# Patient Record
Sex: Female | Born: 1980
Health system: Southern US, Community
[De-identification: ages and names within clinical notes are randomized; demographics above are authoritative.]

## PROBLEM LIST (undated history)

## (undated) DIAGNOSIS — T7840XA Allergy, unspecified, initial encounter: Secondary | ICD-10-CM

## (undated) DIAGNOSIS — F419 Anxiety disorder, unspecified: Secondary | ICD-10-CM

## (undated) DIAGNOSIS — F980 Enuresis not due to a substance or known physiological condition: Secondary | ICD-10-CM

## (undated) HISTORY — DX: Enuresis not due to a substance or known physiological condition: F98.0

## (undated) HISTORY — DX: Anxiety disorder, unspecified: F41.9

## (undated) HISTORY — DX: Allergy, unspecified, initial encounter: T78.40XA

## (undated) HISTORY — PX: TONSILLECTOMY: SUR1361

---

## 2001-05-21 ENCOUNTER — Other Ambulatory Visit: Admission: RE | Admit: 2001-05-21 | Discharge: 2001-05-21 | Payer: Self-pay | Admitting: Family Medicine

## 2002-07-07 ENCOUNTER — Other Ambulatory Visit: Admission: RE | Admit: 2002-07-07 | Discharge: 2002-07-07 | Payer: Self-pay | Admitting: Family Medicine

## 2003-08-14 ENCOUNTER — Other Ambulatory Visit: Admission: RE | Admit: 2003-08-14 | Discharge: 2003-08-14 | Payer: Self-pay | Admitting: Family Medicine

## 2004-09-05 ENCOUNTER — Other Ambulatory Visit: Admission: RE | Admit: 2004-09-05 | Discharge: 2004-09-05 | Payer: Self-pay | Admitting: Family Medicine

## 2004-09-05 ENCOUNTER — Ambulatory Visit: Payer: Self-pay | Admitting: Family Medicine

## 2004-10-30 ENCOUNTER — Emergency Department: Payer: Self-pay | Admitting: Internal Medicine

## 2004-10-31 ENCOUNTER — Ambulatory Visit: Payer: Self-pay | Admitting: Internal Medicine

## 2005-06-16 ENCOUNTER — Ambulatory Visit: Payer: Self-pay | Admitting: Family Medicine

## 2005-07-31 ENCOUNTER — Ambulatory Visit (HOSPITAL_COMMUNITY): Admission: RE | Admit: 2005-07-31 | Discharge: 2005-07-31 | Payer: Self-pay | Admitting: Obstetrics and Gynecology

## 2006-09-24 ENCOUNTER — Inpatient Hospital Stay (HOSPITAL_COMMUNITY): Admission: AD | Admit: 2006-09-24 | Discharge: 2006-09-26 | Payer: Self-pay | Admitting: Obstetrics and Gynecology

## 2006-11-09 ENCOUNTER — Encounter: Payer: Self-pay | Admitting: Family Medicine

## 2007-03-08 ENCOUNTER — Ambulatory Visit: Payer: Self-pay | Admitting: Family Medicine

## 2007-03-08 DIAGNOSIS — J309 Allergic rhinitis, unspecified: Secondary | ICD-10-CM | POA: Insufficient documentation

## 2007-07-01 ENCOUNTER — Ambulatory Visit: Payer: Self-pay | Admitting: Family Medicine

## 2007-07-01 DIAGNOSIS — F419 Anxiety disorder, unspecified: Secondary | ICD-10-CM | POA: Insufficient documentation

## 2008-04-04 ENCOUNTER — Inpatient Hospital Stay (HOSPITAL_COMMUNITY): Admission: AD | Admit: 2008-04-04 | Discharge: 2008-04-05 | Payer: Self-pay | Admitting: Obstetrics and Gynecology

## 2008-09-18 ENCOUNTER — Emergency Department: Payer: Self-pay | Admitting: Internal Medicine

## 2008-11-25 ENCOUNTER — Ambulatory Visit: Payer: Self-pay | Admitting: Family Medicine

## 2008-11-25 DIAGNOSIS — N3944 Nocturnal enuresis: Secondary | ICD-10-CM | POA: Insufficient documentation

## 2008-11-25 LAB — CONVERTED CEMR LAB
Casts: 0 /lpf
Ketones, urine, test strip: NEGATIVE
Mucus, UA: 0
Nitrite: NEGATIVE
Specific Gravity, Urine: 1.015
Urine crystals, microscopic: 0 /hpf
WBC Urine, dipstick: NEGATIVE
WBC, UA: 0 cells/hpf

## 2008-12-09 ENCOUNTER — Telehealth: Payer: Self-pay | Admitting: Family Medicine

## 2009-02-16 ENCOUNTER — Ambulatory Visit: Payer: Self-pay | Admitting: Family Medicine

## 2009-03-23 ENCOUNTER — Ambulatory Visit: Payer: Self-pay | Admitting: Family Medicine

## 2009-03-23 DIAGNOSIS — L299 Pruritus, unspecified: Secondary | ICD-10-CM | POA: Insufficient documentation

## 2009-04-06 ENCOUNTER — Ambulatory Visit: Payer: Self-pay | Admitting: Family Medicine

## 2009-07-05 ENCOUNTER — Telehealth: Payer: Self-pay | Admitting: Family Medicine

## 2009-09-13 ENCOUNTER — Ambulatory Visit: Payer: Self-pay | Admitting: Family Medicine

## 2009-09-13 DIAGNOSIS — M722 Plantar fascial fibromatosis: Secondary | ICD-10-CM | POA: Insufficient documentation

## 2009-09-13 DIAGNOSIS — J029 Acute pharyngitis, unspecified: Secondary | ICD-10-CM | POA: Insufficient documentation

## 2009-09-13 LAB — CONVERTED CEMR LAB: Rapid Strep: NEGATIVE

## 2010-03-07 ENCOUNTER — Telehealth: Payer: Self-pay | Admitting: Family Medicine

## 2010-04-07 ENCOUNTER — Ambulatory Visit: Payer: Self-pay | Admitting: Family Medicine

## 2010-05-30 ENCOUNTER — Telehealth: Payer: Self-pay | Admitting: Family Medicine

## 2010-06-13 ENCOUNTER — Telehealth: Payer: Self-pay | Admitting: Family Medicine

## 2010-08-09 NOTE — Assessment & Plan Note (Signed)
Summary: FLU SHOT  Nurse Visit   Allergies: 1)  ! Pcn 2)  ! Erythromycin  Immunizations Administered:  Influenza Vaccine # 1:    Vaccine Type: Fluvax 3+    Site: left deltoid    Mfr: GlaxoSmithKline    Dose: 0.5 ml    Route: IM    Given by: Mervin Hack CMA (AAMA)    Exp. Date: 01/07/2011    Lot #: [id number]    VIS given: 02/01/10 version given April 07, 2010.  Flu Vaccine Consent Questions:    Do you have a history of severe allergic reactions to this vaccine? no    Any prior history of allergic reactions to egg and/or gelatin? no    Do you have a sensitivity to the preservative Thimersol? no    Do you have a past history of Guillan-Barre Syndrome? no    Do you currently have an acute febrile illness? no    Have you ever had a severe reaction to latex? no    Vaccine information given and explained to patient? yes    Are you currently pregnant? no  Orders Added: 1)  Flu Vaccine 24yrs + [90658] 2)  Admin 1st Vaccine [09811]

## 2010-08-09 NOTE — Progress Notes (Signed)
Summary: alprazolam   Phone Note Refill Request Message from:  Fax from Pharmacy on June 13, 2010 11:48 AM  Refills Requested: Medication #1:  ALPRAZOLAM 0.5 MG TABS 1 by mouth once daily in evening as needed anxiety.   Last Refilled: 03/07/2010 Refill request from [facility] [address]. [phone]  Initial call taken by: Melody Comas,  June 13, 2010 11:48 AM  Follow-up for Phone Call        px written on EMR for call in  Follow-up by: Judith Part MD,  June 13, 2010 3:56 PM  Additional Follow-up for Phone Call Additional follow up Details #1::        Medication phoned to [facility] pharmacy as instructed. Lewanda Rife LPN  June 13, 2010 4:17 PM     Prescriptions: ALPRAZOLAM 0.5 MG TABS (ALPRAZOLAM) 1 by mouth once daily in evening as needed anxiety  #30 x 0   Entered and Authorized by:   Judith Part MD   Signed by:   Lewanda Rife LPN on 75/64/3329   Method used:   [facility]ph[facility] ...       [facility]  [address] [id number]       [address]       [place], [place]  [address]       Ph: [phone]       Fax: [phone]   RxID:   [id number]

## 2010-08-09 NOTE — Progress Notes (Signed)
Summary: refill request for alprazolam  Phone Note Refill Request Message from:  Fax from Pharmacy  Refills Requested: Medication #1:  ALPRAZOLAM 0.5 MG TABS 1 by mouth once daily in evening as needed anxiety.   Last Refilled: 07/06/2009 Faxed request from [facility] [address], [phone]   Initial call taken by: Lowella Petties CMA,  March 07, 2010 1:13 PM  Follow-up for Phone Call        px written on EMR for call in  Follow-up by: Judith Part MD,  March 07, 2010 1:15 PM  Additional Follow-up for Phone Call Additional follow up Details #1::        [facility] as instructed. Lewanda Rife LPN  March 07, 2010 2:29 PM     New/Updated Medications: ALPRAZOLAM 0.5 MG TABS (ALPRAZOLAM) 1 by mouth once daily in evening as needed anxiety Prescriptions: ALPRAZOLAM 0.5 MG TABS (ALPRAZOLAM) 1 by mouth once daily in evening as needed anxiety  #30 x 0   Entered and Authorized by:   Judith Part MD   Signed by:   Lewanda Rife LPN on 29/52/8413   Method used:   Telephoned to ...       [facility]  [id number] (retail)       [address]       [place], [place]  [address]       Ph: [phone]       Fax: [phone]   RxID:   [id number]

## 2010-08-09 NOTE — Assessment & Plan Note (Signed)
Summary: STREP THROAT (?) / LFW   Vital Signs:  Patient profile:   30 year old female Height:      67 inches Weight:      125.4 pounds BMI:     19.71 Temp:     97.9 degrees F oral Pulse rate:   72 / minute Pulse rhythm:   regular BP sitting:   90 / 62  (left arm) Cuff size:   regular  Vitals Entered By: Benny Lennert CMA Duncan Dull) (September 13, 2009 3:11 PM)  History of Present Illness: Chief complaint strep throat  Acute Visit History:      The patient complains of sore throat.  These symptoms began 3 days ago.  She denies cough, fever, headache, musculoskeletal symptoms, and nasal discharge.  Other comments include: DAUGHTER WITH STREP.        There has been a recent exposure to strep.  There is no history of dysphagia, drooling, or cold/URI symptoms associated with the sore throat.        Urine output has been normal.  She is tolerating clear liquids.        Allergies: 1)  ! Pcn 2)  ! Erythromycin  Review of Systems       REVIEW OF SYSTEMS GEN: Acute illness details above. CV: No chest pain or SOB GI: No noted N or V Otherwise, pertinent positives and negatives are noted in the HPI.   Physical Exam  General:  Well-developed,well-nourished,in no acute distress; alert,appropriate and cooperative throughout examination Head:  Normocephalic and atraumatic without obvious abnormalities. No apparent alopecia or balding. Ears:  External ear exam shows no significant lesions or deformities.  Otoscopic examination reveals clear canals, tympanic membranes are intact bilaterally without bulging, retraction, inflammation or discharge. Hearing is grossly normal bilaterally. Nose:  no external deformity.   Mouth:  no gingival abnormalities, no dental plaque, and pharynx pink and moist.   Neck:  No deformities, masses, or tenderness noted. Lungs:  Normal respiratory effort, chest expands symmetrically. Lungs are clear to auscultation, no crackles or wheezes. Heart:  Normal rate and  regular rhythm. S1 and S2 normal without gallop, murmur, click, rub or other extra sounds.   Impression & Recommendations:  Problem # 1:  PHARYNGITIS-ACUTE (ICD-462) daughter with strep treat with abx  pcn allergic rapid strep neg, but daughter with strep  Her updated medication list for this problem includes:    Azithromycin 250 Mg Tabs (Azithromycin) .Marland Kitchen... 2 by  mouth today and then 1 daily for 4 days  Complete Medication List: 1)  Fluticasone Propionate 50 Mcg/act Susp (Fluticasone propionate) .... 2 sprays in each nostril once daily 2)  Ortho Tri-cyclen (28) 0.035 Mg Tabs (Norgestimate-ethinyl estradiol) .... One tab by mouth daily 3)  Zyrtec Allergy 10 Mg Tabs (Cetirizine hcl) .... Take 1 tablet by mouth once a day as needed 4)  Desmopressin Acetate 0.2 Mg Tabs (Desmopressin acetate) .... 2 by mouth  at bedtime as directed 5)  Lexapro 10 Mg Tabs (Escitalopram oxalate) .Marland Kitchen.. 1 by mouth once daily 6)  Alprazolam 0.5 Mg Tabs (Alprazolam) .Marland Kitchen.. 1 by mouth once daily in evening as needed anxiety 7)  Azithromycin 250 Mg Tabs (Azithromycin) .... 2 by  mouth today and then 1 daily for 4 days  Other Orders: Rapid Strep ([address]) Prescriptions: AZITHROMYCIN 250 MG  TABS (AZITHROMYCIN) 2 by  mouth today and then 1 daily for 4 days  #6 x 0   Entered and Authorized by:   Hannah Beat MD  Signed by:   Hannah Beat MD on 09/13/2009   Method used:   [facility]ronically [facility]        [id number]ail[facility]       [address]       [place], [place]  [address]       Ph: [phone]       Fax: [phone]   RxID:   [id number]   Current Allergies (reviewed today): ! PCN ! ERYTHROMYCIN  Laboratory Results    Other Tests  Rapid Strep: negative  Kit Test Internal QC: Negative   (Normal Range: Negative)

## 2010-08-09 NOTE — Progress Notes (Signed)
Summary: Lexapro 10mg   Phone Note Refill Request Call back at [phone] [facility] on May 30, 2010 3:33 PM  Refills Requested: Medication #1:  LEXAPRO 10 MG TABS 1 by mouth once daily   Last Refilled: 04/05/2010 [facility] on Lexapro 10mg . At pt's last visit with Dr Milinda Antis 03/23/09 pt was to follow up in 6 months /  March 2011. Pt has not seen Dr Milinda Antis since the 03/23/09 visit. I called pt to make f/u appt but  left v/m. Since the Lexapro is being given for anxiety instead of depression I wanted to check  about refill. Dr Milinda Antis is out today sick.Please advise.    Method Requested: Telephone to Pharmacy Initial call taken by: Lewanda Rife LPN,  May 30, 2010 3:40 PM  Follow-up for Phone Call        please fill #30, no rf, have her schedule follow up.  thanks.  Follow-up by: Crawford Givens MD,  May 30, 2010 3:46 PM  Additional Follow-up for Phone Call Additional follow up Details #1::        Unable to reach pt by phone but left v/m for pt to call for appt with Dr Milinda Antis. [facility] with a note for pt to call for appt.Lewanda Rife LPN  May 30, 2010 4:05 PM

## 2010-08-20 ENCOUNTER — Inpatient Hospital Stay (HOSPITAL_COMMUNITY)
Admission: AD | Admit: 2010-08-20 | Discharge: 2010-08-20 | Disposition: A | Discharge status: Home / Self Care | Payer: PRIVATE HEALTH INSURANCE | Source: Ambulatory Visit | Attending: Obstetrics | Admitting: Obstetrics

## 2010-08-20 DIAGNOSIS — N920 Excessive and frequent menstruation with regular cycle: Secondary | ICD-10-CM | POA: Insufficient documentation

## 2010-08-20 LAB — CBC
HCT: 36.4 % (ref 36.0–46.0)
Hemoglobin: 12.1 g/dL (ref 12.0–15.0)
Platelets: 131 10*3/uL — ABNORMAL LOW (ref 150–400)
RBC: 3.92 MIL/uL (ref 3.87–5.11)
WBC: 5.6 10*3/uL (ref 4.0–10.5)

## 2010-08-20 LAB — WET PREP, GENITAL
Trich, Wet Prep: NONE SEEN
Yeast Wet Prep HPF POC: NONE SEEN

## 2010-08-20 LAB — URINALYSIS, ROUTINE W REFLEX MICROSCOPIC
Bilirubin Urine: NEGATIVE
Specific Gravity, Urine: 1.025 (ref 1.005–1.030)
Urine Glucose, Fasting: NEGATIVE mg/dL
Urobilinogen, UA: 0.2 mg/dL (ref 0.0–1.0)

## 2010-08-20 LAB — URINE MICROSCOPIC-ADD ON

## 2010-09-26 ENCOUNTER — Telehealth: Payer: Self-pay | Admitting: Family Medicine

## 2010-10-06 NOTE — Progress Notes (Signed)
Summary: alprazolam   Phone Note Refill Request Message from:  Fax from Pharmacy on September 26, 2010 3:30 PM  Refills Requested: Medication #1:  ALPRAZOLAM 0.5 MG TABS 1 by mouth once daily in evening as needed anxiety.   Last Refilled: 06/13/2010 Refill request from [facility]. [phone].   Initial call taken by: Melody Comas,  September 26, 2010 3:30 PM  Follow-up for Phone Call        px written on EMR for call in  Follow-up by: Judith Part MD,  September 26, 2010 5:03 PM  Additional Follow-up for Phone Call Additional follow up Details #1::        [facility] pharmacy as instructed. Lewanda Rife LPN  September 26, 2010 5:07 PM     New/Updated Medications: ALPRAZOLAM 0.5 MG TABS (ALPRAZOLAM) 1 by mouth once daily in evening as needed anxiety Prescriptions: ALPRAZOLAM 0.5 MG TABS (ALPRAZOLAM) 1 by mouth once daily in evening as needed anxiety  #30 x 0   Entered and Authorized by:   Judith Part MD   Signed by:   Lewanda Rife LPN on 11/91/4782   Method used:   Telephoned to ...       [facility]  [id number] (retail)       [address]       [place], [place]  [address]       Ph: [phone]       Fax: [phone]   RxID:   [id number]

## 2010-11-01 ENCOUNTER — Encounter: Payer: Self-pay | Admitting: Family Medicine

## 2010-11-01 ENCOUNTER — Ambulatory Visit (INDEPENDENT_AMBULATORY_CARE_PROVIDER_SITE_OTHER): Payer: PRIVATE HEALTH INSURANCE | Admitting: Family Medicine

## 2010-11-01 VITALS — BP 92/60 | HR 80 | Temp 98.1°F | Wt 132.1 lb

## 2010-11-01 DIAGNOSIS — H109 Unspecified conjunctivitis: Secondary | ICD-10-CM

## 2010-11-01 MED ORDER — POLYMYXIN B-TRIMETHOPRIM 10000-0.1 UNIT/ML-% OP SOLN: 1.0000 [drp] | OPHTHALMIC | Status: AC

## 2010-11-01 NOTE — Assessment & Plan Note (Addendum)
D/w pt EA:[id number] and use of drops.  Doesn't wear contacts.  Fu prn. Okay for outpatient fu.  No FB seen. D/w pt that his may or may not be viral, but given the exposures I would start tx.  She understood.

## 2010-11-01 NOTE — Patient Instructions (Signed)
Start the drops today.  Wash your hands frequently.  Let us know if you have vision changes or other concerns.  Take care.

## 2010-11-01 NOTE — Progress Notes (Signed)
Both daughters with pinkeye.  Now with red eye this AM.  No discharge (yet).  No FCNAVD.  She used allergy drop w/o effect. No vision change.  No ear pain, no rhinorrhea, no ST.    Meds, vitals, and allergies reviewed.   ROS: See HPI.  Otherwise, noncontributory.  nad ncat Tm wnl, nasal and oral exam wnl Conjunctiva: Minimal injection bilaterally, no discharge.  No FB seen.  EOMI, perrl, fundus wnl b.  Neck supple, no la

## 2010-11-02 ENCOUNTER — Encounter: Payer: Self-pay | Admitting: Family Medicine

## 2010-11-22 NOTE — H&P (Signed)
Stephanie [name]                ACCOUNT NO.:  [id number]   MEDICAL RECORD NO.:  [id number]          PATIENT TYPE:  INP   LOCATION:  9119                          FACILITY:  [facility]   PHYSICIAN:  Lenoard Aden, M.D.DATE OF BIRTH:  1981-03-14   DATE OF ADMISSION:  04/04/2008  DATE OF DISCHARGE:                              HISTORY & PHYSICAL   CHIEF COMPLAINT:  Advanced dilatation with mature amnio.   HISTORY OF PRESENT ILLNESS:  She is a 27-year white female G2, P1 at 63-  1/2 weeks with a history of precipitous labor, history of advanced  dilatation, who leaves approximately 1 hour in the hospital, who is now  presents for timed and controlled attempt vaginal delivery, status post  amniocentesis 1 day ago with a mature LS and PG ratio of 3.6, and PT  positive.  The patient has been apprised of the potential risk factors  of prematurity with delivery at 37-38 weeks despite a normal amnio.  This case was discussed with Maternal Fetal Medicine who acknowledges  that this is a reasonable plan for delivery.   ALLERGIES:  She has no known drug allergies.   MEDICATIONS:  Prenatal vitamins.   SOCIAL HISTORY:  She is a nonsmoker, nondrinker.  She denies domestic  physical violence.   REVIEW OF SYSTEMS:  She has a history of advanced dilatation with  precipitous delivery with her first pregnancy at which time she  presented to the office at 8 cm and delivered within 2 hours.   PHYSICAL EXAMINATION:  GENERAL:  She is a well-developed, well-nourished  white female in no acute distress.  HEENT:  Normal.  LUNGS:  Clear.  HEART:  Regular rhythm.  ABDOMEN:  Soft, gravid, nontender.  Estimated fetal weight is 6-1/2 to 7  pounds, cervix is 7 cm, 8% vertex, 0 station.  EXTREMITIES:  There are no cords.  NEUROLOGIC:  Nonfocal.  SKIN:  Intact.   IMPRESSION:  1. 37-1/2-week intrauterine pregnancy.  2. Advanced dilatation with increased geographic distance from the      hospital and mature  amniocentesis.   PLAN:  Proceed with induction, epidural, anticipated attempts at vaginal  delivery.      Lenoard Aden, M.D.  Electronically Signed     RJT/MEDQ  D:  04/04/2008  T:  04/05/2008  Job:  [id number]

## 2010-11-25 NOTE — H&P (Signed)
Stephanie [name], Stephanie [name]                ACCOUNT NO.:  [id number]   MEDICAL RECORD NO.:  [id number]          PATIENT TYPE:  INP   LOCATION:  9161                          [facility]   PHYSICIAN:  Lenoard Aden, M.D.DATE OF BIRTH:  Dec 24, 1980   DATE OF ADMISSION:  09/24/2006  DATE OF DISCHARGE:                              HISTORY & PHYSICAL   CHIEF COMPLAINT:  Labor.   She is a 30 year old white female, G1, P0, at 38-2/7 weeks.  Presented  to the office 6 cm dilated.   SHE HAS NO KNOWN DRUG ALLERGIES.   MEDICATIONS:  Prenatal vitamins.   Pregnancy complicated by advanced dilatation.  She has a history of  tonsillectomy, Chlamydia, infertility for which she conceived on  __________ , history of anorexia.  She is a nonsmoker, nondrinker.  She  denies domestic or physical violence.   FAMILY HISTORY:  1. Heart disease.  2. Bronchitis.  3. Pancreatic, lung and breast cancer.   PHYSICAL EXAM:  She is a well-developed, well-nourished white female in  no acute distress.  HEENT:  Normal.  LUNGS:  Clear.  HEART:  Regular rhythm.  ABDOMEN:  Soft, gravid, nontender.  Estimated fetal weight 7.5 pounds.  Cervix is 6 cm, 90%, vertex +1.  EXTREMITIES:  Reveal no clubbing.  NEUROLOGIC:  Nonfocal.   IMPRESSION:  Term intrauterine pregnancy in active labor.   PLAN:  Anticipate attempts at vaginal delivery.      Lenoard Aden, M.D.  Electronically Signed     RJT/MEDQ  D:  09/24/2006  T:  09/24/2006  Job:  [id number]

## 2010-12-20 ENCOUNTER — Other Ambulatory Visit: Payer: Self-pay | Admitting: *Deleted

## 2010-12-20 MED ORDER — DESMOPRESSIN ACETATE 0.2 MG PO TABS: ORAL_TABLET | ORAL | Status: DC

## 2010-12-20 NOTE — Telephone Encounter (Signed)
Faxed request from [facility] for desmopressin acetate 0.2 mg, 2 tablets by mouth at bedtime, number 60.  This was last refilled on 07/05/09.  Pt states she only takes this occasionally but is now out and needs a refill.

## 2010-12-20 NOTE — Telephone Encounter (Signed)
Px written for call in   

## 2010-12-20 NOTE — Telephone Encounter (Signed)
[facility]  

## 2011-01-16 ENCOUNTER — Other Ambulatory Visit: Payer: Self-pay | Admitting: Family Medicine

## 2011-02-15 ENCOUNTER — Other Ambulatory Visit: Payer: Self-pay | Admitting: *Deleted

## 2011-02-15 MED ORDER — DESMOPRESSIN ACETATE 0.2 MG PO TABS: ORAL_TABLET | ORAL | Status: DC

## 2011-02-15 NOTE — Telephone Encounter (Signed)
Will send electonically

## 2011-03-28 ENCOUNTER — Other Ambulatory Visit: Payer: Self-pay

## 2011-03-28 MED ORDER — ALPRAZOLAM 0.5 MG PO TABS: 0.5000 mg | ORAL_TABLET | Freq: Every day | ORAL | Status: DC | PRN

## 2011-03-28 NOTE — Telephone Encounter (Signed)
[facility] [phone] request refill Alprazolam 0.5 mg. Was taken off med list when pt saw Dr Para March but I contacted pt and she does take periodically and asked to have med refilled.Please advise.

## 2011-03-28 NOTE — Telephone Encounter (Signed)
[facility]  

## 2011-03-28 NOTE — Telephone Encounter (Signed)
Px written for call in   

## 2011-04-10 LAB — CBC
HCT: 38.6
Hemoglobin: 11.6 — ABNORMAL LOW
MCHC: 34.1
MCHC: 34.1
MCV: 93.8
RBC: 3.57 — ABNORMAL LOW
RDW: 13.2
WBC: 11.8 — ABNORMAL HIGH
WBC: 8.5

## 2011-04-10 LAB — RPR: RPR Ser Ql: NONREACTIVE

## 2011-05-11 ENCOUNTER — Ambulatory Visit (INDEPENDENT_AMBULATORY_CARE_PROVIDER_SITE_OTHER): Payer: PRIVATE HEALTH INSURANCE

## 2011-05-11 DIAGNOSIS — Z23 Encounter for immunization: Secondary | ICD-10-CM

## 2011-07-14 ENCOUNTER — Ambulatory Visit (INDEPENDENT_AMBULATORY_CARE_PROVIDER_SITE_OTHER): Payer: PRIVATE HEALTH INSURANCE | Admitting: Family Medicine

## 2011-07-14 ENCOUNTER — Encounter: Payer: Self-pay | Admitting: Family Medicine

## 2011-07-14 VITALS — BP 90/62 | HR 80 | Temp 97.9°F | Ht 67.0 in | Wt 128.5 lb

## 2011-07-14 DIAGNOSIS — H6980 Other specified disorders of Eustachian tube, unspecified ear: Secondary | ICD-10-CM

## 2011-07-14 DIAGNOSIS — H6990 Unspecified Eustachian tube disorder, unspecified ear: Secondary | ICD-10-CM | POA: Insufficient documentation

## 2011-07-14 NOTE — Patient Instructions (Signed)
You have congestion of the inner ear canal - causing pressure and pain  Continue flonase  Try afrin long acting spray 1-2 times daily for 2 days (not more than 2 days) Also try mucinex D - to decongest also  Drink lots of fluids If not starting to improve next week please call  If fever or other symptoms - call

## 2011-07-14 NOTE — Progress Notes (Signed)
Subjective:    Patient ID: Stephanie [name], female    DOB: 1980/12/12, 31 y.o.   MRN: [id number]  HPI R earache for 2 days - while getting over a bad cold of 2 weeks  Still takes flonase and sinuses are difficult to clear  Not unusual to have ETD and popping ears Yesterday used an otc ear wax remover -- did not do anything  This am worse -- bad pain - refers to throat  Still coughs a bit in am  Congestion is getting better - blows nose in ams   Patient Active Problem List  Diagnoses  . ANXIETY STATE, UNSPECIFIED  . SORE THROAT  . ALLERGIC RHINITIS  . PRURITUS  . PLANTAR FASCIITIS  . NOCTURNAL ENURESIS  . Conjunctivitis  . Eustachian tube dysfunction   Past Medical History  Diagnosis Date  . Allergy   . Anxiety   . Enuresis     nocturnal/lifetime   Past Surgical History  Procedure Date  . Tonsillectomy    History  Substance Use Topics  . Smoking status: Never Smoker   . Smokeless tobacco: Not on file  . Alcohol Use: Not on file   Family History  Problem Relation Age of Onset  . Cancer Father     Pancreatic  . Cancer Paternal Grandmother     Breast CA   Allergies  Allergen Reactions  . Erythromycin     REACTION: GI UPSET  . Penicillins     REACTION: HIVES   Current Outpatient Prescriptions on File Prior to Visit  Medication Sig Dispense Refill  . fluticasone (FLONASE) 50 MCG/ACT nasal spray USE 2 SPRAYS IN EACH NOSTRIL ONCE DAILY  16 g  3  . Multiple Vitamin (MULTIVITAMIN) tablet Take 1 tablet by mouth daily.        Lorita Officer Triphasic (ORTHO TRI-CYCLEN, 28,) 0.18/0.215/0.25 MG-35 MCG TABS Take 1 tablet by mouth daily.        Marland Kitchen ALPRAZolam (XANAX) 0.5 MG tablet Take 1 tablet (0.5 mg total) by mouth daily as needed for sleep or anxiety.  30 tablet  0  . desmopressin (DDAVP) 0.2 MG tablet 2 by mouth at bedtime as needed  60 tablet  5  . fexofenadine (ALLEGRA) 180 MG tablet Take 180 mg by mouth daily as needed.            Review of  Systems Review of Systems  Constitutional: Negative for fever, appetite change, fatigue and unexpected weight change.  Eyes: Negative for pain and visual disturbance.  ENT pos for mild congestion/ ear pain/ neg for sinus pain or nosebleed Respiratory: Negative for sob or wheeze Cardiovascular: Negative for cp or palpitations    Gastrointestinal: Negative for nausea, diarrhea and constipation.  Genitourinary: Negative for urgency and frequency.  Skin: Negative for pallor or rash   Neurological: Negative for weakness, light-headedness, numbness and headaches.  Hematological: Negative for adenopathy. Does not bruise/bleed easily.  Psychiatric/Behavioral: Negative for dysphoric mood. The patient is not nervous/anxious.          Objective:   Physical Exam  Constitutional: She appears well-developed and well-nourished.  HENT:  Head: Normocephalic and atraumatic.  Right Ear: External ear normal.  Left Ear: External ear normal.  Mouth/Throat: Oropharynx is clear and moist.       Nares are injected and congested   R TM is dull and slt eff L TM nl  Throat clear- some clear post nasal drip  Eyes: Conjunctivae and EOM are normal. Pupils are  equal, round, and reactive to light. Right eye exhibits no discharge. Left eye exhibits no discharge.  Neck: Normal range of motion. Neck supple.  Cardiovascular: Normal rate, regular rhythm and normal heart sounds.   Pulmonary/Chest: Breath sounds normal.  Lymphadenopathy:    She has no cervical adenopathy.  Neurological: She is alert.  Skin: Skin is warm and dry. No rash noted. No pallor.  Psychiatric: She has a normal mood and affect.          Assessment & Plan:

## 2011-07-14 NOTE — Assessment & Plan Note (Signed)
Worse on R after viral uri  Disc symptomatic care - see instructions on AVS - will try some mucinex D and afrin No s/s of bacterial infection Update if not starting to improve in a week or if worsening

## 2011-09-12 ENCOUNTER — Other Ambulatory Visit: Payer: Self-pay | Admitting: Family Medicine

## 2011-09-17 MED ORDER — ALPRAZOLAM 0.5 MG PO TABS: 0.5000 mg | ORAL_TABLET | Freq: Every day | ORAL | Status: DC | PRN

## 2011-09-17 NOTE — Telephone Encounter (Signed)
Px written for call in   

## 2011-09-18 NOTE — Telephone Encounter (Signed)
Rx called to pharmacy to instructed.

## 2011-11-17 ENCOUNTER — Encounter: Payer: Self-pay | Admitting: Family Medicine

## 2011-11-17 ENCOUNTER — Ambulatory Visit (INDEPENDENT_AMBULATORY_CARE_PROVIDER_SITE_OTHER): Payer: PRIVATE HEALTH INSURANCE | Admitting: Family Medicine

## 2011-11-17 VITALS — BP 110/60 | HR 76 | Temp 97.9°F | Wt 125.8 lb

## 2011-11-17 DIAGNOSIS — N3944 Nocturnal enuresis: Secondary | ICD-10-CM

## 2011-11-17 MED ORDER — TOLTERODINE TARTRATE ER 4 MG PO CP24: 4.0000 mg | ORAL_CAPSULE | Freq: Every day | ORAL | Status: DC

## 2011-11-17 NOTE — Patient Instructions (Signed)
Stop the DDAVP Try the detrol LA one in evening daily  If you can - set alam to wake you up to use bathroom at 1-2 am - empty bladder and see if this helps  Follow up with 6-8 weeks with me please

## 2011-11-17 NOTE — Assessment & Plan Note (Signed)
Ongoing since childhood- occurrence about once per week  No help with DDAVP- will stop that  Trial of detrol LA 4 mg in eve Also set alarm to empty bladder 1-2 am  Update  F/u 6-8 wk If not imp consider urol opinion

## 2011-11-17 NOTE — Progress Notes (Signed)
Subjective:    Patient ID: Stephanie [name], female    DOB: [birth year], 31 y.o.   MRN: [id number]  HPI Is taking desmopressin dose 0.2 mg   (3 pills ) is not working - is frightened to go any higher with it   eneuresis usually happens once per week -- going on since childhood  Has never used a bladder alarm  No fluids after dinner  Usually wakes up as she is wetting  ? If correlates with anything   Tried oxytrol patch a long time ago  ? If it worked or not   No hx of overactive bladder or daytime incontinence   No dysuria or hematuria or other urinary symptoms   Patient Active Problem List  Diagnoses  . ANXIETY STATE, UNSPECIFIED  . SORE THROAT  . ALLERGIC RHINITIS  . PRURITUS  . PLANTAR FASCIITIS  . NOCTURNAL ENURESIS  . Conjunctivitis  . Eustachian tube dysfunction   Past Medical History  Diagnosis Date  . Allergy   . Anxiety   . Enuresis     nocturnal/lifetime   Past Surgical History  Procedure Date  . Tonsillectomy    History  Substance Use Topics  . Smoking status: Never Smoker   . Smokeless tobacco: Not on file  . Alcohol Use: No   Family History  Problem Relation Age of Onset  . Cancer Father     Pancreatic  . Cancer Paternal Grandmother     Breast CA   Allergies  Allergen Reactions  . Erythromycin     REACTION: GI UPSET  . Penicillins     REACTION: HIVES   Current Outpatient Prescriptions on File Prior to Visit  Medication Sig Dispense Refill  . ALPRAZolam (XANAX) 0.5 MG tablet Take 1 tablet (0.5 mg total) by mouth daily as needed for sleep or anxiety.  30 tablet  0  . fexofenadine (ALLEGRA) 180 MG tablet Take 180 mg by mouth daily as needed.        . fluticasone (FLONASE) 50 MCG/ACT nasal spray USE 2 SPRAYS IN EACH NOSTRIL ONCE DAILY  16 g  11  . Multiple Vitamin (MULTIVITAMIN) tablet Take 1 tablet by mouth daily.        Lorita Officer Triphasic (ORTHO TRI-CYCLEN, 28,) 0.18/0.215/0.25 MG-35 MCG TABS Take 1 tablet by mouth daily.         Marland Kitchen ibuprofen (ADVIL,MOTRIN) 200 MG tablet Take 600 mg by mouth every 6 (six) hours as needed.        . tolterodine (DETROL LA) 4 MG 24 hr capsule Take 1 capsule (4 mg total) by mouth daily. In evening  30 capsule  11     Review of Systems Review of Systems  Constitutional: Negative for fever, appetite change, fatigue and unexpected weight change.  Eyes: Negative for pain and visual disturbance.  Respiratory: Negative for cough and shortness of breath.   Cardiovascular: Negative for cp or palpitations    Gastrointestinal: Negative for nausea, diarrhea and constipation.  Genitourinary: Negative for urgency and frequency.pos for night time bed wetting   Skin: Negative for pallor or rash   Neurological: Negative for weakness, light-headedness, numbness and headaches.  Hematological: Negative for adenopathy. Does not bruise/bleed easily.  Psychiatric/Behavioral: Negative for dysphoric mood. The patient is not nervous/anxious.         Objective:   Physical Exam  Constitutional: She appears well-developed and well-nourished. No distress.  HENT:  Head: Normocephalic and atraumatic.  Mouth/Throat: Oropharynx is clear and moist.  Eyes:  Conjunctivae and EOM are normal. Pupils are equal, round, and reactive to light. No scleral icterus.  Neck: Normal range of motion. Neck supple. No JVD present. No thyromegaly present.  Cardiovascular: Normal rate, regular rhythm, normal heart sounds and intact distal pulses.  Exam reveals no gallop.   Pulmonary/Chest: Effort normal and breath sounds normal. No respiratory distress. She has no wheezes.  Abdominal: Soft. Bowel sounds are normal. She exhibits no distension. There is no tenderness.       No suprapubic tenderness    Musculoskeletal: She exhibits no edema and no tenderness.  Lymphadenopathy:    She has no cervical adenopathy.  Neurological: She is alert. She has normal reflexes. No cranial nerve deficit. She exhibits normal muscle tone.  Coordination normal.  Skin: Skin is warm and dry. No rash noted. No erythema. No pallor.  Psychiatric: She has a normal mood and affect.          Assessment & Plan:

## 2011-11-24 ENCOUNTER — Telehealth: Payer: Self-pay | Admitting: *Deleted

## 2011-11-24 ENCOUNTER — Encounter: Payer: Self-pay | Admitting: Family Medicine

## 2011-11-24 MED ORDER — SOLIFENACIN SUCCINATE 10 MG PO TABS: 5.0000 mg | ORAL_TABLET | Freq: Every evening | ORAL | Status: DC

## 2011-11-24 NOTE — Telephone Encounter (Signed)
Received [facility] from [facility] requesting PA on Detrol LA.  Rena spoke with patient several days ago and was advised that she had tried several different medication in the past and that we should have a record of them.  Patient has tried Urised, Detrol LA, and Desinopressin and neither has worked.  Dr. Milinda Antis wants patient to try Vesicar 10mg , one tablet by mouth every day in the evening.  Patient advised as instructed via telephone and agrees to try Vesicar.  [facility]

## 2012-01-02 ENCOUNTER — Other Ambulatory Visit: Payer: Self-pay | Admitting: *Deleted

## 2012-01-02 MED ORDER — ALPRAZOLAM 0.5 MG PO TABS: 0.5000 mg | ORAL_TABLET | Freq: Every day | ORAL | Status: DC | PRN

## 2012-01-02 NOTE — Telephone Encounter (Signed)
Please call in

## 2012-01-02 NOTE — Telephone Encounter (Signed)
TOWER PATIENT ok to fill?

## 2012-01-02 NOTE — Telephone Encounter (Signed)
[facility] 

## 2012-02-15 ENCOUNTER — Other Ambulatory Visit: Payer: Self-pay | Admitting: *Deleted

## 2012-02-15 MED ORDER — ALPRAZOLAM 0.5 MG PO TABS: 0.5000 mg | ORAL_TABLET | Freq: Every day | ORAL | Status: DC | PRN

## 2012-02-15 NOTE — Telephone Encounter (Signed)
Px written for call in   

## 2012-02-15 NOTE — Telephone Encounter (Signed)
[facility] 

## 2012-02-15 NOTE — Telephone Encounter (Signed)
Pt called and was [facility] could pick up alprazolam refill was 02/19/12. Advise pt [facility] this morning and [facility] and call back tomorrow if not there; pt has 1 pill left.

## 2012-02-16 NOTE — Telephone Encounter (Signed)
[facility] 

## 2012-02-22 ENCOUNTER — Other Ambulatory Visit: Payer: Self-pay

## 2012-03-26 ENCOUNTER — Other Ambulatory Visit: Payer: Self-pay | Admitting: *Deleted

## 2012-03-26 MED ORDER — ALPRAZOLAM 0.5 MG PO TABS: 0.5000 mg | ORAL_TABLET | Freq: Every day | ORAL | Status: DC | PRN

## 2012-03-26 NOTE — Telephone Encounter (Signed)
Px written for call in   

## 2012-03-27 NOTE — Telephone Encounter (Signed)
Called in xanax as pescribed

## 2012-04-26 ENCOUNTER — Ambulatory Visit (INDEPENDENT_AMBULATORY_CARE_PROVIDER_SITE_OTHER): Payer: PRIVATE HEALTH INSURANCE

## 2012-04-26 DIAGNOSIS — Z23 Encounter for immunization: Secondary | ICD-10-CM

## 2012-07-08 ENCOUNTER — Other Ambulatory Visit: Payer: Self-pay | Admitting: Family Medicine

## 2012-07-08 MED ORDER — SOLIFENACIN SUCCINATE 10 MG PO TABS: 5.0000 mg | ORAL_TABLET | Freq: Every evening | ORAL | Status: DC

## 2012-07-08 NOTE — Telephone Encounter (Signed)
Done-I do not know where she wants it sent or printed/ etc Px written for call in

## 2012-07-08 NOTE — Telephone Encounter (Signed)
Rx called in to pharm. as prescribed and pt notified

## 2012-07-08 NOTE — Telephone Encounter (Signed)
Pt left vm requesting 3 month supply of Vesicare 10mg  tablet.  States she has met her deductible and would like to get this before new year.  Please advise.

## 2012-10-08 ENCOUNTER — Other Ambulatory Visit: Payer: Self-pay | Admitting: Family Medicine

## 2012-10-08 NOTE — Telephone Encounter (Signed)
Ok to refill? No recent appt, and no future appt

## 2012-10-08 NOTE — Telephone Encounter (Signed)
Please schedule f/u (or PE if she pref) for this summer  refil until then thanks

## 2012-10-08 NOTE — Telephone Encounter (Signed)
Called both # and no answer so left voicemail requesting pt to call office

## 2012-10-08 NOTE — Telephone Encounter (Signed)
Pt left v/m returning call and request call back [phone] or [phone].

## 2012-10-08 NOTE — Telephone Encounter (Signed)
Left voicemail requesting pt to call office 

## 2012-10-10 NOTE — Telephone Encounter (Signed)
F/u appt scheduled an meds refilled until then pt requested only a f/u not CPE because gets CPE done at gyn, pt also needed appt in early June due to scheduled

## 2012-10-10 NOTE — Telephone Encounter (Signed)
Pt left v/m checking on status of flonase refill; request call back [phone].

## 2012-12-09 ENCOUNTER — Encounter: Payer: Self-pay | Admitting: Radiology

## 2012-12-10 ENCOUNTER — Ambulatory Visit (INDEPENDENT_AMBULATORY_CARE_PROVIDER_SITE_OTHER): Payer: PRIVATE HEALTH INSURANCE | Admitting: Family Medicine

## 2012-12-10 ENCOUNTER — Encounter: Payer: Self-pay | Admitting: Family Medicine

## 2012-12-10 VITALS — BP 98/64 | HR 77 | Temp 98.3°F | Ht 67.0 in | Wt 125.8 lb

## 2012-12-10 DIAGNOSIS — Z23 Encounter for immunization: Secondary | ICD-10-CM

## 2012-12-10 DIAGNOSIS — J309 Allergic rhinitis, unspecified: Secondary | ICD-10-CM

## 2012-12-10 DIAGNOSIS — F411 Generalized anxiety disorder: Secondary | ICD-10-CM

## 2012-12-10 MED ORDER — FLUTICASONE PROPIONATE 50 MCG/ACT NA SUSP: 2.0000 | Freq: Every day | NASAL | Status: DC

## 2012-12-10 NOTE — Assessment & Plan Note (Signed)
Refilled flonase which she uses all year  Allegra prn Disc avoidance of allergens Overall doing well

## 2012-12-10 NOTE — Assessment & Plan Note (Signed)
Doing very well - better coping techniques with time  Rarely needs xanax but wants to have it on hand  Good health habits  No anhedonia or signs of depression

## 2012-12-10 NOTE — Patient Instructions (Addendum)
Tdap today I'm glad you are doing well  Take care of yourself

## 2012-12-10 NOTE — Progress Notes (Signed)
Subjective:    Patient ID: Stephanie [name], female    DOB: 1981-06-30, 32 y.o.   MRN: [id number]  HPI Here for follow up of chronic medical problems   Has been doing very well overall  Needs her flonase refilled   Allergy season -not as bad as usual  Did not have to take allegra every single day  Stays on flonase all year   Still on the same OC  Last pap was first week of march - was ok  Too young for mammograms  ? If she will have more kids   Anxiety- has stressors up and down - overall thinks things are going very well  Takes xanax very rarely- only for times of big change  Last dose was early in school year  Now seems to be handling things better   Has not had Td in 10 y Had flu shot   Patient Active Problem List   Diagnosis Date Noted  . PLANTAR FASCIITIS 09/13/2009  . Nocturnal enuresis 11/25/2008  . ANXIETY STATE, UNSPECIFIED 07/01/2007  . ALLERGIC RHINITIS 03/08/2007   Past Medical History  Diagnosis Date  . Allergy   . Anxiety   . Nonorganic enuresis     nocturnal/lifetime   Past Surgical History  Procedure Laterality Date  . Tonsillectomy     History  Substance Use Topics  . Smoking status: Never Smoker   . Smokeless tobacco: Not on file  . Alcohol Use: No   Family History  Problem Relation Age of Onset  . Cancer Father     Pancreatic  . Cancer Paternal Grandmother     Breast CA   Allergies  Allergen Reactions  . Erythromycin     REACTION: GI UPSET  . Penicillins     REACTION: HIVES   Current Outpatient Prescriptions on File Prior to Visit  Medication Sig Dispense Refill  . ALPRAZolam (XANAX) 0.5 MG tablet Take 1 tablet (0.5 mg total) by mouth daily as needed for sleep or anxiety.  30 tablet  5  . fexofenadine (ALLEGRA) 180 MG tablet Take 180 mg by mouth daily as needed.        . fluticasone (FLONASE) 50 MCG/ACT nasal spray USE 2 SPRAYS IN EACH NOSTRIL ONCE DAILY  16 g  1  . ibuprofen (ADVIL,MOTRIN) 200 MG tablet Take 600 mg by mouth every  6 (six) hours as needed.        . Multiple Vitamin (MULTIVITAMIN) tablet Take 1 tablet by mouth daily.        Lorita Officer Triphasic (ORTHO TRI-CYCLEN, 28,) 0.18/0.215/0.25 MG-35 MCG TABS Take 1 tablet by mouth daily.        . solifenacin (VESICARE) 10 MG tablet Take 0.5 tablets (5 mg total) by mouth every evening.  45 tablet  3  . [DISCONTINUED] solifenacin (VESICARE) 10 MG tablet Take 0.5 tablets (5 mg total) by mouth every evening.  30 tablet  11   No current facility-administered medications on file prior to visit.      Review of Systems Review of Systems  Constitutional: Negative for fever, appetite change, fatigue and unexpected weight change.  Eyes: Negative for pain and visual disturbance.  Respiratory: Negative for cough and shortness of breath.   Cardiovascular: Negative for cp or palpitations    Gastrointestinal: Negative for nausea, diarrhea and constipation.  Genitourinary: Negative for urgency and frequency.  Skin: Negative for pallor or rash   Neurological: Negative for weakness, light-headedness, numbness and headaches.  Hematological: Negative for  adenopathy. Does not bruise/bleed easily.  Psychiatric/Behavioral: Negative for dysphoric mood. The patient is not nervous/anxious-this has been well controlled .         Objective:   Physical Exam  Constitutional: She appears well-developed and well-nourished. No distress.  HENT:  Head: Normocephalic and atraumatic.  Right Ear: External ear normal.  Left Ear: External ear normal.  Mouth/Throat: Oropharynx is clear and moist. No oropharyngeal exudate.  Nares are boggy but clear slt clear rhinorrhea   Eyes: Conjunctivae and EOM are normal. Pupils are equal, round, and reactive to light. Right eye exhibits no discharge. Left eye exhibits no discharge. No scleral icterus.  Neck: Normal range of motion. Neck supple. No JVD present. Carotid bruit is not present. No thyromegaly present.  Cardiovascular: Normal rate,  regular rhythm, normal heart sounds and intact distal pulses.  Exam reveals no gallop.   Pulmonary/Chest: Effort normal and breath sounds normal. No respiratory distress. She has no wheezes.  Abdominal: Soft. Bowel sounds are normal. She exhibits no distension and no mass. There is no tenderness.  Musculoskeletal: She exhibits no edema.  Lymphadenopathy:    She has no cervical adenopathy.  Neurological: She is alert. She has normal reflexes. She displays no tremor. No cranial nerve deficit. She exhibits normal muscle tone. Coordination normal.  Skin: Skin is warm and dry. No rash noted. No erythema. No pallor.  Psychiatric: She has a normal mood and affect. Her behavior is normal. Thought content normal. Her mood appears not anxious. She does not exhibit a depressed mood.          Assessment & Plan:

## 2012-12-24 ENCOUNTER — Encounter: Payer: Self-pay | Admitting: Family Medicine

## 2013-02-12 ENCOUNTER — Other Ambulatory Visit: Payer: Self-pay | Admitting: Family Medicine

## 2013-02-13 NOTE — Telephone Encounter (Signed)
Px written for call in   

## 2013-02-13 NOTE — Telephone Encounter (Signed)
Rx called in as prescribed 

## 2013-02-19 ENCOUNTER — Telehealth: Payer: Self-pay

## 2013-02-19 NOTE — Telephone Encounter (Signed)
Done and in IN box 

## 2013-02-19 NOTE — Telephone Encounter (Signed)
Prior auth needed for vesicare; form on Dr Royden Purl shelf. Pt called to ck status of PA; explained PA process to pt and pt will wait to hear from pharmacy.

## 2013-02-20 NOTE — Telephone Encounter (Signed)
PA faxed

## 2013-02-25 NOTE — Telephone Encounter (Signed)
Please let pt know that her insurance denied the prior auth for vesicare-so they will not pay for it  I do not know if there is more we can do besides treating her with other medicines that are generic (like oxybutinin) Have her f/u if she wants to discuss it

## 2013-02-25 NOTE — Telephone Encounter (Signed)
Denial letter received and placed on Dr Royden Purl shelf.

## 2013-02-25 NOTE — Telephone Encounter (Signed)
[facility] left v/m requesting status of PA for vesicare; advised denial letter received and Dr Royden Purl CMA will contact pt about how to proceed. Antonio voiced understanding.

## 2013-02-27 NOTE — Telephone Encounter (Signed)
Pt notified PA was declined and pt is coming in for appt tomorrow to discuss alt meds

## 2013-02-27 NOTE — Telephone Encounter (Signed)
Pt left v/m requesting update on Vesicare PA. Pt request cb.

## 2013-02-28 ENCOUNTER — Encounter: Payer: Self-pay | Admitting: Family Medicine

## 2013-02-28 ENCOUNTER — Ambulatory Visit (INDEPENDENT_AMBULATORY_CARE_PROVIDER_SITE_OTHER): Payer: BC Managed Care – PPO | Admitting: Family Medicine

## 2013-02-28 VITALS — BP 96/58 | HR 99 | Temp 98.6°F | Ht 67.0 in | Wt 126.5 lb

## 2013-02-28 DIAGNOSIS — N3944 Nocturnal enuresis: Secondary | ICD-10-CM

## 2013-02-28 MED ORDER — SOLIFENACIN SUCCINATE 10 MG PO TABS: 10.0000 mg | ORAL_TABLET | Freq: Every day | ORAL | Status: DC

## 2013-02-28 NOTE — Progress Notes (Signed)
Subjective:    Patient ID: Stephanie [name], female    DOB: Jun 04, 1981, 32 y.o.   MRN: [id number]  HPI Here for night time eneuresis Just changed ins to [employer]   For this Detrol LA sid not work  Urised patch did not work  DDAVP did not work   Has not tried anything else   vesicare it the only thing that works  Ins will not pay for -even after a prior auth  No side eff and no urinary retention   Off med- still has wetting at least 2 times per week-no changes No other urinary problems  Patient Active Problem List   Diagnosis Date Noted  . PLANTAR FASCIITIS 09/13/2009  . Nocturnal enuresis 11/25/2008  . ANXIETY STATE, UNSPECIFIED 07/01/2007  . ALLERGIC RHINITIS 03/08/2007   Past Medical History  Diagnosis Date  . Allergy   . Anxiety   . Nonorganic enuresis     nocturnal/lifetime   Past Surgical History  Procedure Laterality Date  . Tonsillectomy     History  Substance Use Topics  . Smoking status: Never Smoker   . Smokeless tobacco: Not on file  . Alcohol Use: No   Family History  Problem Relation Age of Onset  . Cancer Father     Pancreatic  . Cancer Paternal Grandmother     Breast CA   Allergies  Allergen Reactions  . Erythromycin     REACTION: GI UPSET  . Penicillins     REACTION: HIVES   Current Outpatient Prescriptions on File Prior to Visit  Medication Sig Dispense Refill  . fexofenadine (ALLEGRA) 180 MG tablet Take 180 mg by mouth daily as needed.        Marland Kitchen ibuprofen (ADVIL,MOTRIN) 200 MG tablet Take 600 mg by mouth every 6 (six) hours as needed.        . Multiple Vitamin (MULTIVITAMIN) tablet Take 1 tablet by mouth daily.        Lorita Officer Triphasic (ORTHO TRI-CYCLEN, 28,) 0.18/0.215/0.25 MG-35 MCG TABS Take 1 tablet by mouth daily.        Marland Kitchen ALPRAZolam (XANAX) 0.5 MG tablet TAKE 1 TABLET EVERY DAY AS NEEDED FOR ANXIETY  30 tablet  2  . [DISCONTINUED] solifenacin (VESICARE) 10 MG tablet Take 0.5 tablets (5 mg total) by mouth every evening.   30 tablet  11   No current facility-administered medications on file prior to visit.     Review of Systems   Review of Systems  Constitutional: Negative for fever, appetite change, fatigue and unexpected weight change.  Eyes: Negative for pain and visual disturbance.  Respiratory: Negative for cough and shortness of breath.   Cardiovascular: Negative for cp or palpitations    Gastrointestinal: Negative for nausea, diarrhea and constipation.  Genitourinary: Negative for urgency and frequency (except at night) no dysuria or hematuria Skin: Negative for pallor or rash   Neurological: Negative for weakness, light-headedness, numbness and headaches.  Hematological: Negative for adenopathy. Does not bruise/bleed easily.  Psychiatric/Behavioral: Negative for dysphoric mood. The patient is not nervous/anxious.      Objective:   Physical Exam  Constitutional: She appears well-developed and well-nourished.  HENT:  Head: Normocephalic and atraumatic.  Mouth/Throat: Oropharynx is clear and moist.  Eyes: Conjunctivae and EOM are normal. Pupils are equal, round, and reactive to light.  Neck: Normal range of motion. Neck supple. No thyromegaly present.  Cardiovascular: Normal rate and regular rhythm.   Pulmonary/Chest: Effort normal and breath sounds normal.  Abdominal: Soft.  Bowel sounds are normal. She exhibits no distension and no mass. There is no tenderness.  No suprapubic tenderness or fullness    Lymphadenopathy:    She has no cervical adenopathy.  Neurological: She is alert.  Skin: Skin is warm and dry. No rash noted. No pallor.  Psychiatric: She has a normal mood and affect.          Assessment & Plan:

## 2013-02-28 NOTE — Patient Instructions (Addendum)
Call [facility] and find out what medications for overactive bladder or enuresis it covers List includes vesicare, ditropan XL or ditropan short acting, enablex, myrebetriq, toviaz, or sanctura If none of those- ask about imipramine (that is in the antidepressant class)   Call or email me - with a list of what is covered and we will pick one

## 2013-03-02 NOTE — Assessment & Plan Note (Signed)
See AVS- pt will check on status of other anticholinergic agents since vesicare (only med that has helped so far)- is no longer covered by her insurance  Given list to rev and will let me know about coverage re: what we can try She will pay out of pocket for vesicare this mo

## 2013-03-03 ENCOUNTER — Telehealth: Payer: Self-pay | Admitting: *Deleted

## 2013-03-03 ENCOUNTER — Telehealth: Payer: Self-pay | Admitting: Family Medicine

## 2013-03-03 DIAGNOSIS — N3944 Nocturnal enuresis: Secondary | ICD-10-CM

## 2013-03-03 MED ORDER — OXYBUTYNIN CHLORIDE ER 10 MG PO TB24: 10.0000 mg | ORAL_TABLET | Freq: Every day | ORAL | Status: DC

## 2013-03-03 NOTE — Telephone Encounter (Signed)
Pt left vm to discuss other alternatives to Vesicare that are covered by her insurance.  Left message for pt to return call.

## 2013-03-03 NOTE — Telephone Encounter (Signed)
Pt called back and advise [place] that her insurance will approve the ditropan XL or the generic oxybutynin, pt said if this med doesn't work then they advise her to try to do another PA for the vesicare since this will be one of the last meds that she has tried that's covered, [facility] [address] Pt said if the med doesn't work by next week she will call us and let us know

## 2013-03-03 NOTE — Telephone Encounter (Signed)
Ok- let her know I am sending in the ditropan XL now  [place]um it works

## 2013-03-20 ENCOUNTER — Other Ambulatory Visit: Payer: Self-pay | Admitting: Family Medicine

## 2013-04-01 MED ORDER — SOLIFENACIN SUCCINATE 10 MG PO TABS: 10.0000 mg | ORAL_TABLET | Freq: Every day | ORAL | Status: DC

## 2013-04-01 NOTE — Addendum Note (Signed)
Addended by: Roxy Manns A on: 04/01/2013 01:48 PM   Modules accepted: Orders, Medications

## 2013-04-01 NOTE — Telephone Encounter (Signed)
Will send vesicare

## 2013-04-01 NOTE — Telephone Encounter (Signed)
Pt notified Rx sent to pharmacy and advise pt that if we need to do a PA the pharmacy will fax [place] a letter letting us know

## 2013-04-01 NOTE — Telephone Encounter (Addendum)
Pt left v/m; pt has been taking oxybutynin and it is not effective; pt request refill [facility] to [facility] to see if insurance will cover since oxybutynin not working. Pt request cb.

## 2013-04-10 ENCOUNTER — Ambulatory Visit (INDEPENDENT_AMBULATORY_CARE_PROVIDER_SITE_OTHER): Payer: BC Managed Care – PPO

## 2013-04-10 DIAGNOSIS — Z23 Encounter for immunization: Secondary | ICD-10-CM

## 2013-11-11 ENCOUNTER — Encounter: Payer: Self-pay | Admitting: Family Medicine

## 2013-11-11 ENCOUNTER — Ambulatory Visit (INDEPENDENT_AMBULATORY_CARE_PROVIDER_SITE_OTHER): Payer: BC Managed Care – PPO | Admitting: Family Medicine

## 2013-11-11 VITALS — BP 96/62 | HR 76 | Temp 97.9°F | Ht 67.0 in | Wt 127.2 lb

## 2013-11-11 DIAGNOSIS — Z13220 Encounter for screening for lipoid disorders: Secondary | ICD-10-CM | POA: Insufficient documentation

## 2013-11-11 DIAGNOSIS — R5381 Other malaise: Secondary | ICD-10-CM

## 2013-11-11 DIAGNOSIS — R5383 Other fatigue: Secondary | ICD-10-CM | POA: Insufficient documentation

## 2013-11-11 LAB — LIPID PANEL
CHOLESTEROL: 139 mg/dL (ref 0–200)
HDL: 47.1 mg/dL (ref >39.00)
LDL Cholesterol: 78 mg/dL (ref 0–99)
Total CHOL/HDL Ratio: 3
Triglycerides: 70 mg/dL (ref 0.0–149.0)
VLDL: 14 mg/dL (ref 0.0–40.0)

## 2013-11-11 LAB — TSH: TSH: 0.66 u[IU]/mL (ref 0.35–4.50)

## 2013-11-11 LAB — CBC WITH DIFFERENTIAL/PLATELET
Basophils Absolute: 0 10*3/uL (ref 0.0–0.1)
Basophils Relative: 0.6 % (ref 0.0–3.0)
EOS ABS: 0.1 10*3/uL (ref 0.0–0.7)
Eosinophils Relative: 1.5 % (ref 0.0–5.0)
HCT: 40.5 % (ref 36.0–46.0)
Hemoglobin: 13.4 g/dL (ref 12.0–15.0)
Lymphocytes Relative: 29.9 % (ref 12.0–46.0)
Lymphs Abs: 1.6 10*3/uL (ref 0.7–4.0)
MCHC: 33.1 g/dL (ref 30.0–36.0)
MCV: 92.3 fl (ref 78.0–100.0)
MONO ABS: 0.4 10*3/uL (ref 0.1–1.0)
Monocytes Relative: 8 % (ref 3.0–12.0)
NEUTROS PCT: 60 % (ref 43.0–77.0)
Neutro Abs: 3.2 10*3/uL (ref 1.4–7.7)
PLATELETS: 169 10*3/uL (ref 150.0–400.0)
RBC: 4.39 Mil/uL (ref 3.87–5.11)
RDW: 13.3 % (ref 11.5–15.5)
WBC: 5.3 10*3/uL (ref 4.0–10.5)

## 2013-11-11 NOTE — Progress Notes (Signed)
Subjective:    Patient ID: Stephanie [name], female    DOB: Jan 25, 1981, 33 y.o.   MRN: [id number]  HPI Had her annual exam with her gyn - Dr Edwyna Ready   Has not been getting cholesterol screens   Interested in a lipid profile  Diet is pretty good -not perfect  Does not eat a lot of red meat  Occasional fatty foods -not every day  Some exercise -on and off- walking or Jillian Micheals DVD   Wt is stable -takes care of herself   Family history:  Sister with high cholesterol - also irregular heartbeat had ablation , is overweight  MGF had heart disease   No hx of anemia Is tired at times  Nl periods   Patient Active Problem List   Diagnosis Date Noted  . PLANTAR FASCIITIS 09/13/2009  . Nocturnal enuresis 11/25/2008  . ANXIETY STATE, UNSPECIFIED 07/01/2007  . ALLERGIC RHINITIS 03/08/2007   Past Medical History  Diagnosis Date  . Allergy   . Anxiety   . Nonorganic enuresis     nocturnal/lifetime   Past Surgical History  Procedure Laterality Date  . Tonsillectomy     History  Substance Use Topics  . Smoking status: Never Smoker   . Smokeless tobacco: Not on file  . Alcohol Use: No   Family History  Problem Relation Age of Onset  . Cancer Father     Pancreatic  . Cancer Paternal Grandmother     Breast CA   Allergies  Allergen Reactions  . Erythromycin     REACTION: GI UPSET  . Penicillins     REACTION: HIVES   Current Outpatient Prescriptions on File Prior to Visit  Medication Sig Dispense Refill  . ALPRAZolam (XANAX) 0.5 MG tablet TAKE 1 TABLET EVERY DAY AS NEEDED FOR ANXIETY  30 tablet  2  . fexofenadine (ALLEGRA) 180 MG tablet Take 180 mg by mouth daily as needed.        . fluticasone (FLONASE) 50 MCG/ACT nasal spray USE 2 SPRAYS IN EACH NOSTRIL ONCE DAILY  16 g  2  . Multiple Vitamin (MULTIVITAMIN) tablet Take 1 tablet by mouth daily.        Lenard Forth Triphasic (ORTHO TRI-CYCLEN, 28,) 0.18/0.215/0.25 MG-35 MCG TABS Take 1 tablet by mouth  daily.        . solifenacin (VESICARE) 10 MG tablet Take 1 tablet (10 mg total) by mouth daily.  30 tablet  11  . [DISCONTINUED] solifenacin (VESICARE) 10 MG tablet Take 0.5 tablets (5 mg total) by mouth every evening.  30 tablet  11   No current facility-administered medications on file prior to visit.    Review of Systems Review of Systems  Constitutional: Negative for fever, appetite change,  and unexpected weight change. pos for fatigue -poss from schedule  Eyes: Negative for pain and visual disturbance.  Respiratory: Negative for cough and shortness of breath.   Cardiovascular: Negative for cp or palpitations    Gastrointestinal: Negative for nausea, diarrhea and constipation.  Genitourinary: Negative for urgency and frequency. Neg for heavy menses   Skin: Negative for pallor or rash   Neurological: Negative for weakness, light-headedness, numbness and headaches.  Hematological: Negative for adenopathy. Does not bruise/bleed easily.  Psychiatric/Behavioral: Negative for dysphoric mood. The patient is not nervous/anxious.         Objective:   Physical Exam  Constitutional: She appears well-developed and well-nourished.  HENT:  Head: Normocephalic and atraumatic.  Mouth/Throat: Oropharynx is clear and  moist.  Eyes: Conjunctivae and EOM are normal. Pupils are equal, round, and reactive to light. No scleral icterus.  Neck: Normal range of motion. Neck supple. Carotid bruit is not present. No thyromegaly present.  Cardiovascular: Normal rate, regular rhythm, normal heart sounds and intact distal pulses.  Exam reveals no gallop.   No murmur heard. Pulmonary/Chest: Effort normal and breath sounds normal. No respiratory distress. She has no wheezes. She has no rales.  Abdominal: Soft. Bowel sounds are normal.  Musculoskeletal: She exhibits no edema.  Lymphadenopathy:    She has no cervical adenopathy.  Neurological: She is alert. She has normal reflexes. No cranial nerve deficit. She  exhibits normal muscle tone. Coordination normal.  Skin: Skin is warm and dry. No rash noted. No erythema. No pallor.  Psychiatric: She has a normal mood and affect.          Assessment & Plan:

## 2013-11-11 NOTE — Patient Instructions (Signed)
Eat a healthy well balanced diet  Also get regular exercise and sleep Labs today

## 2013-11-11 NOTE — Progress Notes (Signed)
Pre visit review using our clinic review tool, if applicable. No additional management support is needed unless otherwise documented below in the visit note. 

## 2013-11-12 NOTE — Assessment & Plan Note (Signed)
Lab today Rev diet - for the most part pretty good  Rev exercise habits Avoid red meat/ fried foods/ egg yolks/ fatty breakfast meats/ butter, cheese and high fat dairy/ and shellfish

## 2013-11-12 NOTE — Assessment & Plan Note (Signed)
Check cbc/ tsh today  Disc imp of exercise/ good sleep habits and self care

## 2013-11-27 ENCOUNTER — Telehealth: Payer: Self-pay

## 2013-11-27 NOTE — Telephone Encounter (Signed)
I think her imms are up to date  Just have a nurse visit for PPD and I will fill out form when result returns

## 2013-11-27 NOTE — Telephone Encounter (Signed)
Pt has a health exam form from [employer] for employment; pt said ask if any limitations for hearing, vision and lifting; also ask if pt is upto date on immun; hep B, Tetanus and MMR and last TB skin test. Pt was recently seen but wants to know if needs to schedule appt to have form filled out and pt is not sure if she has ever had TB skin test and pt will try to get copy of immunization record to see if up to date.

## 2013-11-27 NOTE — Telephone Encounter (Signed)
Pt notified imms are utd and nurse appt scheduled for 12/03/13

## 2013-12-03 ENCOUNTER — Ambulatory Visit (INDEPENDENT_AMBULATORY_CARE_PROVIDER_SITE_OTHER): Payer: BC Managed Care – PPO | Admitting: *Deleted

## 2013-12-03 DIAGNOSIS — Z111 Encounter for screening for respiratory tuberculosis: Secondary | ICD-10-CM

## 2013-12-03 NOTE — Telephone Encounter (Signed)
I put it in IN box for Friday pick up

## 2013-12-03 NOTE — Telephone Encounter (Signed)
Pt came and had her TB Skin test placed. Pt dropped off form, form placed in your inbox for pick up on Friday when she comes back to have TB skin test read

## 2013-12-05 LAB — TB SKIN TEST: TB Skin Test: NEGATIVE

## 2014-01-01 ENCOUNTER — Other Ambulatory Visit: Payer: Self-pay | Admitting: Family Medicine

## 2014-01-01 NOTE — Telephone Encounter (Signed)
plz phone in. 

## 2014-01-01 NOTE — Telephone Encounter (Signed)
Rx called in as prescribed 

## 2014-01-01 NOTE — Telephone Encounter (Signed)
Electronic refill request, last refilled 02/12/13 #30 with 2 additional refills, Dr. Glori Bickers out of office, please advise

## 2014-03-18 ENCOUNTER — Other Ambulatory Visit: Payer: Self-pay | Admitting: Family Medicine

## 2014-03-26 ENCOUNTER — Ambulatory Visit (INDEPENDENT_AMBULATORY_CARE_PROVIDER_SITE_OTHER): Payer: BC Managed Care – PPO

## 2014-03-26 DIAGNOSIS — Z23 Encounter for immunization: Secondary | ICD-10-CM

## 2014-04-01 ENCOUNTER — Other Ambulatory Visit: Payer: Self-pay | Admitting: Family Medicine

## 2014-04-02 NOTE — Telephone Encounter (Signed)
Rx sent electronically.  

## 2014-04-02 NOTE — Telephone Encounter (Signed)
Please refill for 6 mo 

## 2014-04-02 NOTE — Telephone Encounter (Signed)
Electronic refill request, no recent/future appt., please advise  

## 2014-06-07 ENCOUNTER — Other Ambulatory Visit: Payer: Self-pay | Admitting: Family Medicine

## 2014-06-08 NOTE — Telephone Encounter (Signed)
Rx called in as prescribed 

## 2014-06-08 NOTE — Telephone Encounter (Signed)
Px written for call in   

## 2014-06-29 ENCOUNTER — Other Ambulatory Visit: Payer: Self-pay | Admitting: Family Medicine

## 2014-06-29 NOTE — Telephone Encounter (Signed)
Please refill for 6 mo  Schedule PE in 6 mo if she wants to

## 2014-06-29 NOTE — Telephone Encounter (Signed)
Electronic refill request, no recent/future appt., please advise  

## 2014-06-30 NOTE — Telephone Encounter (Signed)
Rx refilled as directed. Offered to schedule CPE and she said she normally goes to GYN for CPE.

## 2014-10-30 ENCOUNTER — Other Ambulatory Visit: Payer: Self-pay | Admitting: Family Medicine

## 2014-10-30 NOTE — Telephone Encounter (Signed)
Last office visit 11/11/2013.  Ok to refill?

## 2014-10-30 NOTE — Telephone Encounter (Signed)
appt scheduled and med refilled 

## 2014-10-30 NOTE — Telephone Encounter (Signed)
Please schedule summer f/u and refill until then 

## 2014-12-11 ENCOUNTER — Ambulatory Visit: Payer: Self-pay | Admitting: Family Medicine

## 2014-12-16 ENCOUNTER — Encounter: Payer: Self-pay | Admitting: Family Medicine

## 2014-12-16 ENCOUNTER — Ambulatory Visit (INDEPENDENT_AMBULATORY_CARE_PROVIDER_SITE_OTHER): Payer: BLUE CROSS/BLUE SHIELD | Admitting: Family Medicine

## 2014-12-16 VITALS — BP 102/60 | HR 76 | Temp 98.2°F | Ht 67.0 in | Wt 130.2 lb

## 2014-12-16 DIAGNOSIS — J302 Other seasonal allergic rhinitis: Secondary | ICD-10-CM | POA: Diagnosis not present

## 2014-12-16 DIAGNOSIS — N3944 Nocturnal enuresis: Secondary | ICD-10-CM

## 2014-12-16 MED ORDER — FLUTICASONE PROPIONATE 50 MCG/ACT NA SUSP: 2.0000 | Freq: Every day | NASAL | Status: DC

## 2014-12-16 MED ORDER — SOLIFENACIN SUCCINATE 10 MG PO TABS: 10.0000 mg | ORAL_TABLET | Freq: Every day | ORAL | Status: DC

## 2014-12-16 NOTE — Patient Instructions (Signed)
I'm glad you are doing well  Take care of yourself  No change in medicine

## 2014-12-16 NOTE — Progress Notes (Signed)
Subjective:    Patient ID: Stephanie [name], female    DOB: 1981/06/18, 34 y.o.   MRN: [id number]  HPI Here for f/u of chronic medical problems   Nothing new going on   Allergy problems were better this year  On allegra and flonase   Last gyn visit was 1 mo ago - did a pap and all was ok  Still on current oral contraceptive   Lab Results  Component Value Date   CHOL 139 11/11/2013   HDL 47.10 11/11/2013   LDLCALC 78 11/11/2013   TRIG 70.0 11/11/2013   CHOLHDL 3 11/11/2013   very good profile  Eats pretty healthy   Still vesicare- for pm eneuresis  Works most of the time  Limits fluids after dinner  No stress incontinence    Wt is up 3 lb with bmi of 20  Exercise is walking - does well with that   Patient Active Problem List   Diagnosis Date Noted  . Screening for lipoid disorders 11/11/2013  . Fatigue 11/11/2013  . PLANTAR FASCIITIS 09/13/2009  . Nocturnal enuresis 11/25/2008  . ANXIETY STATE, UNSPECIFIED 07/01/2007  . ALLERGIC RHINITIS 03/08/2007   Past Medical History  Diagnosis Date  . Allergy   . Anxiety   . Nonorganic enuresis     nocturnal/lifetime   Past Surgical History  Procedure Laterality Date  . Tonsillectomy     History  Substance Use Topics  . Smoking status: Never Smoker   . Smokeless tobacco: Not on file  . Alcohol Use: No   Family History  Problem Relation Age of Onset  . Cancer Father     Pancreatic  . Cancer Paternal Grandmother     Breast CA   Allergies  Allergen Reactions  . Erythromycin     REACTION: GI UPSET  . Penicillins     REACTION: HIVES   Current Outpatient Prescriptions on File Prior to Visit  Medication Sig Dispense Refill  . fexofenadine (ALLEGRA) 180 MG tablet Take 180 mg by mouth daily as needed.      . fluticasone (FLONASE) 50 MCG/ACT nasal spray USE 2 SPRAYS IN EACH NOSTRIL ONCE DAILY 16 g 6  . Multiple Vitamin (MULTIVITAMIN) tablet Take 1 tablet by mouth daily.      Lenard Forth Triphasic  (ORTHO TRI-CYCLEN, 28,) 0.18/0.215/0.25 MG-35 MCG TABS Take 1 tablet by mouth daily.      . VESICARE 10 MG tablet TAKE 1 TABLET BY MOUTH EVERY DAY 30 tablet 1   No current facility-administered medications on file prior to visit.      Review of Systems Review of Systems  Constitutional: Negative for fever, appetite change, fatigue and unexpected weight change.  Eyes: Negative for pain and visual disturbance.  ENT pos for occ cong and rhinorrhea  Respiratory: Negative for cough and shortness of breath.   Cardiovascular: Negative for cp or palpitations    Gastrointestinal: Negative for nausea, diarrhea and constipation.  Genitourinary: Negative for urgency and frequency. pos for incontinence at night that is improved  Skin: Negative for pallor or rash   Neurological: Negative for weakness, light-headedness, numbness and headaches.  Hematological: Negative for adenopathy. Does not bruise/bleed easily.  Psychiatric/Behavioral: Negative for dysphoric mood. The patient is not nervous/anxious.         Objective:   Physical Exam  Constitutional: She appears well-developed and well-nourished. No distress.  HENT:  Head: Normocephalic and atraumatic.  Right Ear: External ear normal.  Left Ear: External ear normal.  Mouth/Throat: Oropharynx is clear and moist.  Nares are boggy and pale  No sinus tenderness Clear rhinorrhea and post nasal drip   Eyes: Conjunctivae and EOM are normal. Pupils are equal, round, and reactive to light. Right eye exhibits no discharge. Left eye exhibits no discharge.  Neck: Normal range of motion. Neck supple.  Cardiovascular: Normal rate and normal heart sounds.   Pulmonary/Chest: Effort normal and breath sounds normal. No respiratory distress. She has no wheezes. She has no rales. She exhibits no tenderness.  Abdominal: Soft. Bowel sounds are normal. She exhibits no distension and no mass. There is no tenderness. There is no rebound and no guarding.  No  suprapubic tenderness or fullness   No cva tenderness   Lymphadenopathy:    She has no cervical adenopathy.  Neurological: She is alert.  Skin: Skin is warm and dry. No rash noted.  Psychiatric: She has a normal mood and affect.          Assessment & Plan:   Problem List Items Addressed This Visit    Allergic rhinitis    Fairly controlled with allegra and flonase and allergen avoidance  Refilled  Will continue to monitor       Nocturnal enuresis - Primary    Doing well with vesicare 10 mg currently  Also restricts fluids at night  Will continue to monitor       Relevant Medications   solifenacin (VESICARE) 10 MG tablet

## 2014-12-16 NOTE — Progress Notes (Signed)
Pre visit review using our clinic review tool, if applicable. No additional management support is needed unless otherwise documented below in the visit note. 

## 2014-12-17 NOTE — Assessment & Plan Note (Signed)
Fairly controlled with allegra and flonase and allergen avoidance  Refilled  Will continue to monitor

## 2014-12-17 NOTE — Assessment & Plan Note (Signed)
Doing well with vesicare 10 mg currently  Also restricts fluids at night  Will continue to monitor

## 2014-12-28 ENCOUNTER — Other Ambulatory Visit: Payer: Self-pay | Admitting: Family Medicine

## 2015-01-04 ENCOUNTER — Encounter: Payer: Self-pay | Admitting: Primary Care

## 2015-01-04 ENCOUNTER — Ambulatory Visit (INDEPENDENT_AMBULATORY_CARE_PROVIDER_SITE_OTHER): Payer: BLUE CROSS/BLUE SHIELD | Admitting: Primary Care

## 2015-01-04 VITALS — BP 102/68 | HR 90 | Temp 98.0°F | Ht 67.0 in | Wt 129.8 lb

## 2015-01-04 DIAGNOSIS — H9202 Otalgia, left ear: Secondary | ICD-10-CM | POA: Diagnosis not present

## 2015-01-04 NOTE — Patient Instructions (Signed)
Your ears do not appear infected. Try taking ibuprofen 800 mg three times daily for pain. Continue Flonase and Allegra.  Call me if your pain becomes worse or does not improve by Wednesday this week.  It was very nice meeting you!

## 2015-01-04 NOTE — Progress Notes (Signed)
Subjective:    Patient ID: Stephanie [name], female    DOB: 11-04-1980, 34 y.o.   MRN: [id number]  HPI  [name] is a 34 year old female who presents today with a chief complaint of ear pain. Her pain is located to the left ear. She recenlty returned from a beach trip on Friday. She noticed a deep ear pain to her left ear on Friday afternoon. She describes her pain as a "deep throbbing" pain. She woke up Saturday morning with tinlging and pain to the left side of her face. She reports slight improvement to her pain today. Denies submerging her head under water and has not recently been ill. She tried ibuprofen and olive oil without relief.   Review of Systems  Constitutional: Negative for fever and chills.  HENT: Positive for ear pain. Negative for congestion, rhinorrhea, sinus pressure and sore throat.   Respiratory: Negative for cough and shortness of breath.   Cardiovascular: Negative for chest pain.  Gastrointestinal: Negative for nausea.  Musculoskeletal: Negative for myalgias.  Neurological: Negative for numbness.       Past Medical History  Diagnosis Date  . Allergy   . Anxiety   . Nonorganic enuresis     nocturnal/lifetime    History   Social History  . Marital Status: Married    Spouse Name: N/A  . Number of Children: N/A  . Years of Education: N/A   Occupational History  . [employer]    Social History Main Topics  . Smoking status: Never Smoker   . Smokeless tobacco: Not on file  . Alcohol Use: No  . Drug Use: No  . Sexual Activity: Not on file   Other Topics Concern  . Not on file   Social History Narrative    Past Surgical History  Procedure Laterality Date  . Tonsillectomy      Family History  Problem Relation Age of Onset  . Cancer Father     Pancreatic  . Cancer Paternal Grandmother     Breast CA    Allergies  Allergen Reactions  . Erythromycin     REACTION: GI UPSET  . Penicillins     REACTION: HIVES    Current Outpatient  Prescriptions on File Prior to Visit  Medication Sig Dispense Refill  . fexofenadine (ALLEGRA) 180 MG tablet Take 180 mg by mouth daily as needed.      . fluticasone (FLONASE) 50 MCG/ACT nasal spray Place 2 sprays into both nostrils daily. 16 g 11  . Multiple Vitamin (MULTIVITAMIN) tablet Take 1 tablet by mouth daily.      Lenard Forth Triphasic (ORTHO TRI-CYCLEN, 28,) 0.18/0.215/0.25 MG-35 MCG TABS Take 1 tablet by mouth daily.      . solifenacin (VESICARE) 10 MG tablet Take 1 tablet (10 mg total) by mouth daily. 30 tablet 11   No current facility-administered medications on file prior to visit.    BP 102/68 mmHg  Pulse 90  Temp(Src) 98 F (36.7 C) (Oral)  Ht 5\' 7"  (1.702 m)  Wt 129 lb 12.8 oz (58.877 kg)  BMI 20.32 kg/m2  SpO2 98%  LMP 12/12/2014    Objective:   Physical Exam  Constitutional: She is oriented to person, place, and time. She appears well-nourished.  HENT:  Right Ear: Tympanic membrane and ear canal normal.  Left Ear: Tympanic membrane and ear canal normal.  Mouth/Throat: Oropharynx is clear and moist.  No obvious signs of infection/irritation.  Eyes: Conjunctivae are normal. Pupils are  equal, round, and reactive to light.  Neck: Neck supple.  Cardiovascular: Normal rate and regular rhythm.   Pulmonary/Chest: Effort normal and breath sounds normal.  Lymphadenopathy:    She has no cervical adenopathy.  Neurological: She is alert and oriented to person, place, and time. No cranial nerve deficit.  Face symmetrical.  Skin: Skin is warm and dry.          Assessment & Plan:  Ear pain:  Left side. TM's and canals unremarkable bilaterally. No effusion, redness, bulging. No signs of bells palsy. Face symmetrical.  Ibuprofen 800 mg TID, flonase, continue allegra. Follow up if no improvement in symptoms by Thursday.

## 2015-01-04 NOTE — Progress Notes (Signed)
Pre visit review using our clinic review tool, if applicable. No additional management support is needed unless otherwise documented below in the visit note. 

## 2015-04-23 ENCOUNTER — Ambulatory Visit (INDEPENDENT_AMBULATORY_CARE_PROVIDER_SITE_OTHER): Payer: BLUE CROSS/BLUE SHIELD

## 2015-04-23 DIAGNOSIS — Z23 Encounter for immunization: Secondary | ICD-10-CM

## 2015-05-11 ENCOUNTER — Telehealth: Payer: Self-pay | Admitting: *Deleted

## 2015-05-11 NOTE — Telephone Encounter (Signed)
[place] approved for Vesicare from 05/11/15- 05/10/17, conf # [id number], pharmacy aware and ins company will fax over approval letter within the next few days

## 2015-06-04 ENCOUNTER — Other Ambulatory Visit: Payer: Self-pay | Admitting: Family Medicine

## 2015-07-14 ENCOUNTER — Other Ambulatory Visit: Payer: Self-pay | Admitting: Family Medicine

## 2015-07-14 NOTE — Telephone Encounter (Signed)
Electronic refill request, med not on med list, last Rx was 06/08/14, pt had a med refill appt on 12/16/14, please advise

## 2015-07-14 NOTE — Telephone Encounter (Signed)
She uses it infrequently   Px written for call in

## 2015-07-14 NOTE — Telephone Encounter (Signed)
Rx called in as prescribed 

## 2015-08-31 ENCOUNTER — Encounter: Payer: Self-pay | Admitting: Family Medicine

## 2015-08-31 ENCOUNTER — Ambulatory Visit (INDEPENDENT_AMBULATORY_CARE_PROVIDER_SITE_OTHER): Payer: BLUE CROSS/BLUE SHIELD | Admitting: Family Medicine

## 2015-08-31 VITALS — BP 112/62 | HR 80 | Temp 98.2°F | Ht 67.0 in | Wt 129.2 lb

## 2015-08-31 DIAGNOSIS — H578 Other specified disorders of eye and adnexa: Secondary | ICD-10-CM

## 2015-08-31 DIAGNOSIS — J302 Other seasonal allergic rhinitis: Secondary | ICD-10-CM

## 2015-08-31 DIAGNOSIS — H5789 Other specified disorders of eye and adnexa: Secondary | ICD-10-CM

## 2015-08-31 NOTE — Progress Notes (Signed)
Subjective:    Patient ID: Stephanie [name], female    DOB: [birth year], 35 y.o.   MRN: [id number]  HPI Here with allergy symptoms  Worse lately   R eye is problematic - her eye gets watery and swollen lid several times per week  Gets red and itchy and burns  Comes and goes  Thought it was an allergy to nail polish- stopped it and it got better for quite a while Tried to change products -no further improvement   Uses a cool compress Takes about 2 hours to get better  Going on for 6 mo now - not seasonal   Still takes allegra every day  [facility] drops [facility]   Is interested in allergy testing   Some nasal symptoms - rhinorrhea  Allegra and flonase all year   Does not wear contact lenses  Had last eye exam this summer - now has reading glasses/otherwise no problems   Patient Active Problem List   Diagnosis Date Noted  . Eye swelling, left 08/31/2015  . Screening for lipoid disorders 11/11/2013  . Fatigue 11/11/2013  . PLANTAR FASCIITIS 09/13/2009  . Nocturnal enuresis 11/25/2008  . ANXIETY STATE, UNSPECIFIED 07/01/2007  . Allergic rhinitis 03/08/2007   Past Medical History  Diagnosis Date  . Allergy   . Anxiety   . Nonorganic enuresis     nocturnal/lifetime   Past Surgical History  Procedure Laterality Date  . Tonsillectomy     Social History  Substance Use Topics  . Smoking status: Never Smoker   . Smokeless tobacco: None  . Alcohol Use: No   Family History  Problem Relation Age of Onset  . Cancer Father     Pancreatic  . Cancer Paternal Grandmother     Breast CA   Allergies  Allergen Reactions  . Erythromycin     REACTION: GI UPSET  . Penicillins     REACTION: HIVES   Current Outpatient Prescriptions on File Prior to Visit  Medication Sig Dispense Refill  . ALPRAZolam (XANAX) 0.25 MG tablet Take 1-2 pills by mouth daily as needed for anxiety 60 tablet 0  . fexofenadine (ALLEGRA) 180 MG tablet Take 180 mg by mouth daily as needed.       . fluticasone (FLONASE) 50 MCG/ACT nasal spray Place 2 sprays into both nostrils daily. 16 g 11  . Multiple Vitamin (MULTIVITAMIN) tablet Take 1 tablet by mouth daily.      Lenard Forth Triphasic (ORTHO TRI-CYCLEN, 28,) 0.18/0.215/0.25 MG-35 MCG TABS Take 1 tablet by mouth daily.      . solifenacin (VESICARE) 10 MG tablet Take 1 tablet (10 mg total) by mouth daily. 30 tablet 11   No current facility-administered medications on file prior to visit.    Review of Systems Review of Systems  Constitutional: Negative for fever, appetite change, fatigue and unexpected weight change.  ENT pos or rhinorrhea and pnd/neg for st or sinus pain  Eyes: Negative for pain and visual disturbance. pos for recurrent upper R lid swelling  Respiratory: Negative for cough and shortness of breath.   Cardiovascular: Negative for cp or palpitations    Gastrointestinal: Negative for nausea, diarrhea and constipation.  Genitourinary: Negative for urgency and frequency.  Skin: Negative for pallor or rash  neg for urticaria or eczema  Neurological: Negative for weakness, light-headedness, numbness and headaches.  Hematological: Negative for adenopathy. Does not bruise/bleed easily.  Psychiatric/Behavioral: Negative for dysphoric mood. The patient is not nervous/anxious.  Objective:   Physical Exam  Constitutional: She appears well-developed and well-nourished. No distress.  Well appearing   HENT:  Head: Normocephalic and atraumatic.  Right Ear: External ear normal.  Left Ear: External ear normal.  Mouth/Throat: Oropharynx is clear and moist. No oropharyngeal exudate.  Boggy nares Some clear PND  No sinus tenderness   Eyes: Conjunctivae and EOM are normal. Pupils are equal, round, and reactive to light. Right eye exhibits no discharge. Left eye exhibits no discharge. No scleral icterus.  No eye symptoms today  Neck: Normal range of motion. Neck supple.  Cardiovascular: Normal rate and  regular rhythm.   Pulmonary/Chest: Effort normal and breath sounds normal. No respiratory distress. She has no wheezes. She has no rales.  Musculoskeletal: She exhibits no edema.  Lymphadenopathy:    She has no cervical adenopathy.  Neurological: She is alert. No cranial nerve deficit.  Skin: Skin is warm and dry. No rash noted. No erythema. No pallor.  Psychiatric: She has a normal mood and affect.          Assessment & Plan:   Problem List Items Addressed This Visit      Respiratory   Allergic rhinitis    Early seasonal symptoms due to trees blooming Allegra and flonase help Ref to allergist for eye symptoms      Relevant Orders   Ambulatory referral to Allergy     Other   Eye swelling, left - Primary    Recurrent  Lid swelling/ itch/burn  Unsure of cause Resembles allergic rxn  Recommend cool compress and benadryl when it occurs Ref to allergist for further eval       Relevant Orders   Ambulatory referral to Allergy

## 2015-08-31 NOTE — Progress Notes (Signed)
Pre visit review using our clinic review tool, if applicable. No additional management support is needed unless otherwise documented below in the visit note. 

## 2015-08-31 NOTE — Patient Instructions (Signed)
Stop at check out for referral to allergist When eye swells- use cool compress and try benadryl over the counter (oral)

## 2015-09-01 NOTE — Assessment & Plan Note (Signed)
Early seasonal symptoms due to trees blooming Allegra and flonase help Ref to allergist for eye symptoms

## 2015-09-01 NOTE — Assessment & Plan Note (Signed)
Recurrent  Lid swelling/ itch/burn  Unsure of cause Resembles allergic rxn  Recommend cool compress and benadryl when it occurs Ref to allergist for further eval

## 2015-09-03 ENCOUNTER — Other Ambulatory Visit: Payer: Self-pay | Admitting: *Deleted

## 2015-09-03 MED ORDER — SOLIFENACIN SUCCINATE 10 MG PO TABS: 10.0000 mg | ORAL_TABLET | Freq: Every day | ORAL | Status: DC

## 2015-09-03 NOTE — Telephone Encounter (Signed)
Received fax requesting Rx to be changed to a 90 day supply, done 

## 2016-01-09 LAB — HM PAP SMEAR: HM Pap smear: NORMAL

## 2016-01-14 ENCOUNTER — Other Ambulatory Visit: Payer: Self-pay | Admitting: *Deleted

## 2016-01-14 MED ORDER — FLUTICASONE PROPIONATE 50 MCG/ACT NA SUSP: 2.0000 | Freq: Every day | NASAL | Status: DC

## 2016-01-31 DIAGNOSIS — Z01419 Encounter for gynecological examination (general) (routine) without abnormal findings: Secondary | ICD-10-CM | POA: Diagnosis not present

## 2016-01-31 DIAGNOSIS — Z6820 Body mass index (BMI) 20.0-20.9, adult: Secondary | ICD-10-CM | POA: Diagnosis not present

## 2016-03-16 ENCOUNTER — Other Ambulatory Visit: Payer: Self-pay | Admitting: Family Medicine

## 2016-03-17 NOTE — Telephone Encounter (Signed)
Please schedule 30 min f/u for late fall and refill until then

## 2016-03-17 NOTE — Telephone Encounter (Signed)
Pt's had had a few acute appts, but no recent f/u or CPE appts, please advise

## 2016-03-17 NOTE — Telephone Encounter (Signed)
appt scheduled and med refilled 

## 2016-03-19 ENCOUNTER — Other Ambulatory Visit: Payer: Self-pay | Admitting: Family Medicine

## 2016-04-24 ENCOUNTER — Encounter: Payer: Self-pay | Admitting: Family Medicine

## 2016-04-24 ENCOUNTER — Ambulatory Visit (INDEPENDENT_AMBULATORY_CARE_PROVIDER_SITE_OTHER): Payer: BLUE CROSS/BLUE SHIELD | Admitting: Family Medicine

## 2016-04-24 VITALS — BP 104/58 | HR 80 | Temp 98.2°F | Ht 67.0 in | Wt 130.2 lb

## 2016-04-24 DIAGNOSIS — J301 Allergic rhinitis due to pollen: Secondary | ICD-10-CM

## 2016-04-24 DIAGNOSIS — N3944 Nocturnal enuresis: Secondary | ICD-10-CM

## 2016-04-24 DIAGNOSIS — Z23 Encounter for immunization: Secondary | ICD-10-CM

## 2016-04-24 DIAGNOSIS — F419 Anxiety disorder, unspecified: Secondary | ICD-10-CM | POA: Diagnosis not present

## 2016-04-24 MED ORDER — FLUTICASONE PROPIONATE 50 MCG/ACT NA SUSP: 2.0000 | Freq: Every day | NASAL | 11 refills | Status: DC

## 2016-04-24 MED ORDER — SOLIFENACIN SUCCINATE 10 MG PO TABS: 10.0000 mg | ORAL_TABLET | Freq: Every day | ORAL | 3 refills | Status: DC

## 2016-04-24 NOTE — Progress Notes (Signed)
Subjective:    Patient ID: Stephanie [name], female    DOB: 08-Feb-1981, 35 y.o.   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 Eye swelling, left 08/31/2015  . Screening for lipoid disorders 11/11/2013  . Fatigue 11/11/2013  . PLANTAR FASCIITIS 09/13/2009  . Nocturnal enuresis 11/25/2008  . Anxiety disorder 07/01/2007  . Allergic rhinitis 03/08/2007   Past Medical History:  Diagnosis Date  . Allergy   . Anxiety   . Nonorganic enuresis    nocturnal/lifetime   Past Surgical History:  Procedure Laterality Date  . TONSILLECTOMY     Social History  Substance Use Topics  . Smoking status: Never Smoker  . Smokeless tobacco: Never Used  . Alcohol use No   Family History  Problem Relation Age of Onset  . Cancer Father     Pancreatic  . Cancer Paternal Grandmother     Breast CA   Allergies  Allergen Reactions  . Erythromycin     REACTION: GI UPSET  . Penicillins     REACTION: HIVES   Current Outpatient Prescriptions on File Prior to Visit  Medication Sig Dispense Refill  . ALPRAZolam (XANAX) 0.25 MG tablet Take 1-2 pills by mouth daily  as needed for anxiety 60 tablet 0  . fexofenadine (ALLEGRA) 180 MG tablet Take 180 mg by mouth daily as needed.      . Multiple Vitamin (MULTIVITAMIN) tablet Take 1 tablet by mouth daily.      Lenard Forth Triphasic (ORTHO TRI-CYCLEN, 28,) 0.18/0.215/0.25 MG-35 MCG TABS Take 1 tablet by mouth daily.       No current facility-administered medications on file prior to visit.     Review of Systems Review of Systems  Constitutional: Negative for fever, appetite change, fatigue and unexpected weight change.  ENT neg for sinus pain or ST Eyes: Negative for pain and visual disturbance.  Respiratory: Negative for cough and shortness of breath.   Cardiovascular: Negative for cp or palpitations    Gastrointestinal: Negative for nausea, diarrhea and constipation.  Genitourinary: Negative for urgency and frequency. neg for enuresis lately with medication Skin: Negative for pallor or rash   Neurological: Negative for weakness, light-headedness, numbness and headaches.  Hematological: Negative for adenopathy. Does not bruise/bleed easily.  Psychiatric/Behavioral: Negative for dysphoric mood. The patient is occ nervous/anxious.         Objective:   Physical Exam  Constitutional: She appears well-developed and well-nourished.  No distress.  HENT:  Head: Normocephalic and atraumatic.  Right Ear: External ear normal.  Left Ear: External ear normal.  Mouth/Throat: Oropharynx is clear and moist.  Nares are clear and boggy  Eyes: Conjunctivae and EOM are normal. Pupils are equal, round, and reactive to light.  Neck: Normal range of motion. Neck supple. No JVD present. Carotid bruit is not present. No thyromegaly present.  Cardiovascular: Normal rate, regular rhythm, normal heart sounds and intact distal pulses.  Exam reveals no gallop.   Pulmonary/Chest: Effort normal and breath sounds normal. No respiratory distress. She has no wheezes. She has no rales.  No crackles  Abdominal: Soft. Bowel  sounds are normal. She exhibits no distension, no abdominal bruit and no mass. There is no tenderness.  Musculoskeletal: She exhibits no edema.  Lymphadenopathy:    She has no cervical adenopathy.  Neurological: She is alert. She has normal reflexes.  Skin: Skin is warm and dry. No rash noted. No pallor.  Psychiatric: She has a normal mood and affect. Her speech is normal and behavior is normal. Thought content normal. Her mood appears not anxious. Her affect is not blunt, not labile and not inappropriate. She does not exhibit a depressed mood.          Assessment & Plan:   Problem List Items Addressed This Visit      Respiratory   Allergic rhinitis     Other   Anxiety disorder    Doing well overall  Uses xanax 0.25 sparingly  Reviewed stressors/ coping techniques/symptoms/ support sources/ tx options and side effects in detail today  Does not need a refill at this time       Nocturnal enuresis    Doing very well with vesicare now - 10mg   Will refill it  No issues       Relevant Medications   solifenacin (VESICARE) 10 MG tablet    Other Visit Diagnoses    Need for influenza vaccination    -  Primary   Relevant Orders   Flu Vaccine QUAD 36+ mos IM (Completed)

## 2016-04-24 NOTE — Assessment & Plan Note (Signed)
Doing well overall  Uses xanax 0.25 sparingly  Reviewed stressors/ coping techniques/symptoms/ support sources/ tx options and side effects in detail today  Does not need a refill at this time

## 2016-04-24 NOTE — Progress Notes (Signed)
Pre visit review using our clinic review tool, if applicable. No additional management support is needed unless otherwise documented below in the visit note. 

## 2016-04-24 NOTE — Patient Instructions (Signed)
Take care of yourself -work and balance  Eat a healthy diet No change in medicine  Flu shot today

## 2016-04-24 NOTE — Assessment & Plan Note (Signed)
Doing very well with vesicare now - 10mg   Will refill it  No issues

## 2016-05-10 ENCOUNTER — Other Ambulatory Visit: Payer: Self-pay | Admitting: Family Medicine

## 2016-05-10 NOTE — Telephone Encounter (Signed)
Px written for call in   

## 2016-05-10 NOTE — Telephone Encounter (Signed)
Alprazolam called into [facility] [address] 

## 2016-05-10 NOTE — Telephone Encounter (Signed)
Pt had f/u on 04/23/16, last filled on 07/14/15 #60 with 0 refills, please advise

## 2016-05-15 ENCOUNTER — Other Ambulatory Visit: Payer: Self-pay | Admitting: Family Medicine

## 2016-05-20 DIAGNOSIS — J020 Streptococcal pharyngitis: Secondary | ICD-10-CM | POA: Diagnosis not present

## 2016-06-09 DIAGNOSIS — J019 Acute sinusitis, unspecified: Secondary | ICD-10-CM | POA: Diagnosis not present

## 2016-07-05 DIAGNOSIS — N390 Urinary tract infection, site not specified: Secondary | ICD-10-CM | POA: Diagnosis not present

## 2016-08-15 ENCOUNTER — Other Ambulatory Visit: Payer: Self-pay | Admitting: Family Medicine

## 2016-08-15 NOTE — Telephone Encounter (Signed)
Rx called in as prescribed 

## 2016-08-15 NOTE — Telephone Encounter (Signed)
Last OV on 04/24/16, last filled on 05/10/16 #60 tabs with 0 refill, please advise

## 2016-08-15 NOTE — Telephone Encounter (Signed)
Px written for call in   

## 2016-09-05 ENCOUNTER — Other Ambulatory Visit: Payer: Self-pay | Admitting: Family Medicine

## 2016-09-22 DIAGNOSIS — Z808 Family history of malignant neoplasm of other organs or systems: Secondary | ICD-10-CM | POA: Diagnosis not present

## 2016-09-22 DIAGNOSIS — D485 Neoplasm of uncertain behavior of skin: Secondary | ICD-10-CM | POA: Diagnosis not present

## 2016-09-22 DIAGNOSIS — D2239 Melanocytic nevi of other parts of face: Secondary | ICD-10-CM | POA: Diagnosis not present

## 2016-10-09 ENCOUNTER — Other Ambulatory Visit: Payer: Self-pay | Admitting: Family Medicine

## 2016-11-02 ENCOUNTER — Other Ambulatory Visit: Payer: Self-pay | Admitting: Family Medicine

## 2016-11-11 ENCOUNTER — Other Ambulatory Visit: Payer: Self-pay | Admitting: Family Medicine

## 2016-11-13 NOTE — Telephone Encounter (Signed)
Px written for call in   

## 2016-11-13 NOTE — Telephone Encounter (Signed)
Last OV was 04/24/16, last filled on 08/15/16 #60 tabs with 0 refills, please advise

## 2016-11-13 NOTE — Telephone Encounter (Signed)
Rx called in as prescribed 

## 2017-02-13 ENCOUNTER — Other Ambulatory Visit: Payer: Self-pay | Admitting: Family Medicine

## 2017-02-13 NOTE — Telephone Encounter (Signed)
Rx called in as prescribed 

## 2017-02-13 NOTE — Telephone Encounter (Signed)
Px written for call in   

## 2017-02-13 NOTE — Telephone Encounter (Signed)
Last f/u was 04/25/16 and no future appts, last filled on 11/13/16 #60 tabs with 0 refills, please advise

## 2017-03-19 DIAGNOSIS — Z01419 Encounter for gynecological examination (general) (routine) without abnormal findings: Secondary | ICD-10-CM | POA: Diagnosis not present

## 2017-03-19 DIAGNOSIS — Z6820 Body mass index (BMI) 20.0-20.9, adult: Secondary | ICD-10-CM | POA: Diagnosis not present

## 2017-04-15 ENCOUNTER — Other Ambulatory Visit: Payer: Self-pay | Admitting: Family Medicine

## 2017-04-16 NOTE — Telephone Encounter (Signed)
Last OV was 04/24/16 no future appts., xanax last filled on 02/13/17 #60 tabs with 0 refills, please advise

## 2017-04-16 NOTE — Telephone Encounter (Signed)
Px written for call in   Please schedule a winter f/u

## 2017-04-16 NOTE — Telephone Encounter (Signed)
Rx called in as prescribed and f/u appt scheduled

## 2017-04-27 ENCOUNTER — Ambulatory Visit (INDEPENDENT_AMBULATORY_CARE_PROVIDER_SITE_OTHER): Payer: BLUE CROSS/BLUE SHIELD | Admitting: Family Medicine

## 2017-04-27 ENCOUNTER — Encounter: Payer: Self-pay | Admitting: Family Medicine

## 2017-04-27 VITALS — BP 92/60 | HR 67 | Temp 97.9°F | Wt 131.0 lb

## 2017-04-27 DIAGNOSIS — F419 Anxiety disorder, unspecified: Secondary | ICD-10-CM | POA: Diagnosis not present

## 2017-04-27 DIAGNOSIS — Z23 Encounter for immunization: Secondary | ICD-10-CM | POA: Diagnosis not present

## 2017-04-27 DIAGNOSIS — N3944 Nocturnal enuresis: Secondary | ICD-10-CM | POA: Diagnosis not present

## 2017-04-27 DIAGNOSIS — J301 Allergic rhinitis due to pollen: Secondary | ICD-10-CM

## 2017-04-27 MED ORDER — SOLIFENACIN SUCCINATE 10 MG PO TABS: 10.0000 mg | ORAL_TABLET | Freq: Every day | ORAL | 3 refills | Status: DC

## 2017-04-27 MED ORDER — FLUTICASONE PROPIONATE 50 MCG/ACT NA SUSP: 2.0000 | Freq: Every day | NASAL | 11 refills | Status: DC

## 2017-04-27 NOTE — Progress Notes (Signed)
Subjective:    Patient ID: Stephanie [name], female    DOB: 10/05/1980, 36 y.o.   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    Wt Readings from Last 3 Encounters:  04/27/17 131 lb (59.4 kg)  04/24/16 130 lb 4 oz (59.1 kg)  08/31/15 129 lb 4 oz (58.6 kg)  takes care of herself  Taking barre class and loves it  Eats healthy also  20.52 kg/m    Allergic rhinitis Needs refill of flonase - works well for her  Not a bad year    Nocturnal enuresis/chronic  Takes vesicare -no side effects  Failed other medications Still working fine  Condition has not progressed    Anxiety  Takes low dose xanax prn  Has been improved this year  Biggest stress is work - but since she gets long breaks it helps  Work life balance is hard - it is a lot  Kids are involved in a lot  Uses xanax 1/2 pill occasionally -not every day    Patient Active Problem List   Diagnosis Date Noted  . Eye swelling, left 08/31/2015  . Screening for lipoid disorders 11/11/2013  . Fatigue 11/11/2013  . PLANTAR FASCIITIS 09/13/2009  . Nocturnal enuresis 11/25/2008  . Anxiety disorder 07/01/2007  . Allergic rhinitis 03/08/2007   Past Medical History:  Diagnosis Date  . Allergy   . Anxiety   . Nonorganic enuresis    nocturnal/lifetime   Past Surgical History:  Procedure Laterality Date  . TONSILLECTOMY     Social History  Substance Use Topics  . Smoking status: Never Smoker  . Smokeless tobacco: Never Used  . Alcohol use No   Family History  Problem Relation Age of Onset  . Cancer Father        Pancreatic  . Cancer Paternal Grandmother        Breast CA   Allergies  Allergen Reactions  . Erythromycin     REACTION: GI UPSET  . Penicillins     REACTION: HIVES   Current Outpatient Prescriptions on File Prior to Visit  Medication Sig Dispense Refill  . ALPRAZolam (XANAX) 0.25 MG tablet TAKE 1 TO  2 TABLETS BY MOUTH EVERY DAY AS NEEDED 60 tablet 0  . fexofenadine (ALLEGRA) 180 MG tablet Take 180 mg by mouth daily as needed.      . Multiple Vitamin (MULTIVITAMIN) tablet Take 1 tablet by mouth daily.      Lenard Forth Triphasic (ORTHO TRI-CYCLEN, 28,) 0.18/0.215/0.25 MG-35 MCG TABS Take 1 tablet by mouth daily.       No current facility-administered medications on file prior to visit.      Review of Systems  Constitutional: Negative for activity change, appetite change, fatigue, fever and unexpected weight change.  HENT: Positive for postnasal drip and rhinorrhea. Negative for congestion, ear pain, sinus pressure and sore throat.   Eyes: Negative for pain, redness and visual disturbance.  Respiratory: Negative for cough, shortness of breath and wheezing.   Cardiovascular: Negative for chest pain and palpitations.  Gastrointestinal: Negative for abdominal pain, blood in stool, constipation and diarrhea.  Endocrine: Negative for polydipsia and polyuria.  Genitourinary: Positive for enuresis. Negative for dysuria, frequency and urgency.  Musculoskeletal: Negative for arthralgias, back pain and myalgias.  Skin: Negative for pallor and rash.  Allergic/Immunologic: Negative for environmental allergies.  Neurological: Negative  for dizziness, syncope and headaches.  Hematological: Negative for adenopathy. Does not bruise/bleed easily.  Psychiatric/Behavioral: Negative for decreased concentration and dysphoric mood. The patient is not nervous/anxious.        Pos for occasional anxiety       Objective:   Physical Exam  Constitutional: She appears well-developed and well-nourished. No distress.  Well appearing   HENT:  Head: Normocephalic and atraumatic.  Mouth/Throat: Oropharynx is clear and moist.  Eyes: Pupils are equal, round, and reactive to light. Conjunctivae and EOM are normal.  Neck: Normal range of motion. Neck supple. No JVD present. Carotid bruit is not present. No  thyromegaly present.  Cardiovascular: Normal rate, regular rhythm, normal heart sounds and intact distal pulses.  Exam reveals no gallop.   Pulmonary/Chest: Effort normal and breath sounds normal. No respiratory distress. She has no wheezes. She has no rales.  No crackles  Abdominal: Soft. Bowel sounds are normal. She exhibits no distension, no abdominal bruit and no mass. There is no tenderness.  No suprapubic tenderness or fullness    Musculoskeletal: She exhibits no edema.  Lymphadenopathy:    She has no cervical adenopathy.  Neurological: She is alert. She has normal reflexes.  Skin: Skin is warm and dry. No rash noted.  Psychiatric: She has a normal mood and affect.          Assessment & Plan:   Problem List Items Addressed This Visit      Respiratory   Allergic rhinitis - Primary    Refilled flonase  Continue allegra in season  Avoid allergens when able         Other   Anxiety disorder    Mild and intermittent Reviewed stressors/ coping techniques/symptoms/ support sources/ tx options and side effects in detail today  Uses xanax prn -not regularly / to help with this and sleep      Nocturnal enuresis    vesicare still works well for her  No changes  Refilled for the year  Avoids excessive fluids before bed       Relevant Medications   solifenacin (VESICARE) 10 MG tablet    Other Visit Diagnoses    Need for influenza vaccination       Relevant Orders   Flu Vaccine QUAD 6+ mos PF IM (Fluarix Quad PF) (Completed)

## 2017-04-27 NOTE — Patient Instructions (Addendum)
I'm glad you are doing well   Use xanax only when necessary   No change in medicines  Keep exercising  Take care of yourself   Flu shot today

## 2017-04-29 NOTE — Assessment & Plan Note (Addendum)
vesicare still works well for her  No changes  Refilled for the year  Avoids excessive fluids before bed

## 2017-04-29 NOTE — Assessment & Plan Note (Signed)
Mild and intermittent Reviewed stressors/ coping techniques/symptoms/ support sources/ tx options and side effects in detail today  Uses xanax prn -not regularly / to help with this and sleep

## 2017-04-29 NOTE — Assessment & Plan Note (Signed)
Refilled flonase  Continue allegra in season  Avoid allergens when able

## 2017-05-16 ENCOUNTER — Telehealth: Payer: Self-pay | Admitting: *Deleted

## 2017-05-16 NOTE — Telephone Encounter (Signed)
PA for pt's vesicare was done at www.covermymeds.com and it was approved. I notified the pharmacy and I will await the approval faxed letter they send

## 2017-07-18 ENCOUNTER — Other Ambulatory Visit: Payer: Self-pay | Admitting: Family Medicine

## 2017-07-18 NOTE — Telephone Encounter (Signed)
Rx called in as prescribed 

## 2017-07-18 NOTE — Telephone Encounter (Signed)
Px written for call in   

## 2017-07-18 NOTE — Telephone Encounter (Signed)
Med refill appt on 04/27/17, last filled on 04/16/17 #60 tabs with 0 refills, please advise

## 2017-10-23 ENCOUNTER — Other Ambulatory Visit: Payer: Self-pay | Admitting: Family Medicine

## 2017-10-23 MED ORDER — ALPRAZOLAM 0.25 MG PO TABS: ORAL_TABLET | ORAL | 0 refills | Status: DC

## 2017-10-23 NOTE — Telephone Encounter (Signed)
Copied from [place] [id number]. Topic: General - Other >> Oct 23, 2017  2:26 PM Darl Householder, RMA wrote: Reason for CRM: Medication refill request for ALPRAZolam Duanne Moron) 0.25 MG tablet to be sent to [facility]

## 2017-10-23 NOTE — Telephone Encounter (Signed)
Last Rx 07/18/2017 #60 . Last OV 04/2018

## 2017-11-05 ENCOUNTER — Encounter: Payer: Self-pay | Admitting: Family Medicine

## 2017-11-05 ENCOUNTER — Ambulatory Visit: Payer: BLUE CROSS/BLUE SHIELD | Admitting: Family Medicine

## 2017-11-05 VITALS — BP 90/64 | HR 78 | Temp 97.6°F | Ht 67.0 in | Wt 129.5 lb

## 2017-11-05 DIAGNOSIS — F419 Anxiety disorder, unspecified: Secondary | ICD-10-CM

## 2017-11-05 DIAGNOSIS — N3944 Nocturnal enuresis: Secondary | ICD-10-CM

## 2017-11-05 DIAGNOSIS — J301 Allergic rhinitis due to pollen: Secondary | ICD-10-CM | POA: Diagnosis not present

## 2017-11-05 MED ORDER — SOLIFENACIN SUCCINATE 10 MG PO TABS: 10.0000 mg | ORAL_TABLET | Freq: Every day | ORAL | 3 refills | Status: DC

## 2017-11-05 NOTE — Progress Notes (Signed)
Subjective:    Patient ID: Stephanie [name], female    DOB: [birth year], 37 y.o.   MRN: [id number]  HPI Here for f/u of chronic medical problems   Feeling good  Year has been fine   Taking care of herself    Wt Readings from Last 3 Encounters:  11/05/17 129 lb 8 oz (58.7 kg)  04/27/17 131 lb (59.4 kg)  04/24/16 130 lb 4 oz (59.1 kg)   20.28 kg/m   Allergic rhinitis -not as bad this spring  Some watery eyes after being outdoors Taking allegra and flonase -staying in good control    Anxiety disorder-has actually been a lot better  Stress is up and down  Xanax- not using for a while (took it during January - caring for her grandmother)-who since passed away   Nocturnal enuresis  vesicare still works great  No issues at all-no breakthrough episodes   Sees gyn in the summer    Patient Active Problem List   Diagnosis Date Noted  . Eye swelling, left 08/31/2015  . Screening for lipoid disorders 11/11/2013  . Fatigue 11/11/2013  . PLANTAR FASCIITIS 09/13/2009  . Nocturnal enuresis 11/25/2008  . Anxiety disorder 07/01/2007  . Allergic rhinitis 03/08/2007   Past Medical History:  Diagnosis Date  . Allergy   . Anxiety   . Nonorganic enuresis    nocturnal/lifetime   Past Surgical History:  Procedure Laterality Date  . TONSILLECTOMY     Social History   Tobacco Use  . Smoking status: Never Smoker  . Smokeless tobacco: Never Used  Substance Use Topics  . Alcohol use: No    Alcohol/week: 0.0 oz  . Drug use: No   Family History  Problem Relation Age of Onset  . Cancer Father        Pancreatic  . Cancer Paternal Grandmother        Breast CA   Allergies  Allergen Reactions  . Erythromycin     REACTION: GI UPSET  . Penicillins     REACTION: HIVES   Current Outpatient Medications on File Prior to Visit  Medication Sig Dispense Refill  . ALPRAZolam (XANAX) 0.25 MG tablet TAKE 1 TO 2 TABLETS BY MOUTH TWICE A DAY AS NEEDED 60 tablet 0  . fexofenadine  (ALLEGRA) 180 MG tablet Take 180 mg by mouth daily as needed.      . fluticasone (FLONASE) 50 MCG/ACT nasal spray Place 2 sprays into both nostrils daily. 16 g 11  . Multiple Vitamin (MULTIVITAMIN) tablet Take 1 tablet by mouth daily.      Lenard Forth Triphasic (ORTHO TRI-CYCLEN, 28,) 0.18/0.215/0.25 MG-35 MCG TABS Take 1 tablet by mouth daily.       No current facility-administered medications on file prior to visit.     Review of Systems  Constitutional: Negative for activity change, appetite change, fatigue, fever and unexpected weight change.  HENT: Negative for congestion, ear pain, rhinorrhea, sinus pressure and sore throat.   Eyes: Negative for pain, redness and visual disturbance.  Respiratory: Negative for cough, shortness of breath and wheezing.   Cardiovascular: Negative for chest pain and palpitations.  Gastrointestinal: Negative for abdominal pain, blood in stool, constipation and diarrhea.  Endocrine: Negative for polydipsia and polyuria.  Genitourinary: Negative for dysuria, frequency and urgency.  Musculoskeletal: Negative for arthralgias, back pain and myalgias.  Skin: Negative for pallor and rash.  Allergic/Immunologic: Negative for environmental allergies.  Neurological: Negative for dizziness, syncope and headaches.  Hematological: Negative for adenopathy.  Does not bruise/bleed easily.  Psychiatric/Behavioral: Negative for decreased concentration and dysphoric mood. The patient is not nervous/anxious.        No anxiety lately       Objective:   Physical Exam  Constitutional: She appears well-developed and well-nourished. No distress.  Slim and well appearing   HENT:  Head: Normocephalic and atraumatic.  Right Ear: External ear normal.  Left Ear: External ear normal.  Mouth/Throat: Oropharynx is clear and moist.  Nares are boggy but clear   Eyes: Pupils are equal, round, and reactive to light. Conjunctivae and EOM are normal.  Neck: Normal range of  motion. Neck supple. No JVD present. Carotid bruit is not present. No thyromegaly present.  Cardiovascular: Normal rate, regular rhythm, normal heart sounds and intact distal pulses. Exam reveals no gallop.  Pulmonary/Chest: Effort normal and breath sounds normal. No respiratory distress. She has no wheezes. She has no rales.  No crackles  Abdominal: Soft. Bowel sounds are normal. She exhibits no distension, no abdominal bruit and no mass. There is no tenderness.  No suprapubic tenderness or fullness     Musculoskeletal: She exhibits no edema.  Lymphadenopathy:    She has no cervical adenopathy.  Neurological: She is alert. She has normal reflexes. She displays normal reflexes. She exhibits normal muscle tone.  No tremor   Skin: Skin is warm and dry. No rash noted.  Psychiatric: She has a normal mood and affect. Her mood appears not anxious. Her affect is not blunt and not labile. She does not exhibit a depressed mood.  Mood is good/pleasant          Assessment & Plan:   Problem List Items Addressed This Visit      Respiratory   Allergic rhinitis    Doing better so far this pollen season  Continues allegra and flonase          Other   Anxiety disorder    Depends on stressors but doing better lately  Has xanax on hand as needed  Continue to follow Reviewed stressors/ coping techniques/symptoms/ support sources/ tx options and side effects in detail today       Nocturnal enuresis - Primary    vesicare is working well with this  No breakthrough events /bed wetting  Refilled for a year  Is utd with gyn for that care       Relevant Medications   solifenacin (VESICARE) 10 MG tablet

## 2017-11-05 NOTE — Assessment & Plan Note (Signed)
vesicare is working well with this  No breakthrough events /bed wetting  Refilled for a year  Is utd with gyn for that care

## 2017-11-05 NOTE — Assessment & Plan Note (Signed)
Depends on stressors but doing better lately  Has xanax on hand as needed  Continue to follow Reviewed stressors/ coping techniques/symptoms/ support sources/ tx options and side effects in detail today

## 2017-11-05 NOTE — Assessment & Plan Note (Signed)
Doing better so far this pollen season  [employer] and flonase

## 2017-11-05 NOTE — Patient Instructions (Signed)
Take care of yourself  I'm glad you are doing well   I refilled vesicare   Keep working on balance and self care

## 2018-03-26 ENCOUNTER — Other Ambulatory Visit: Payer: Self-pay | Admitting: *Deleted

## 2018-03-26 MED ORDER — ALPRAZOLAM 0.25 MG PO TABS: ORAL_TABLET | ORAL | 0 refills | Status: DC

## 2018-03-26 NOTE — Telephone Encounter (Signed)
Name of Medication: Xanax Name of Pharmacy: [facility] [address] Henrietta Dine or Written Date and Quantity: 10/23/17 #60 tabs with 0 refills Last Office Visit and Type: 3 month f/u on 11/05/17 Next Office Visit and Type: none scheduled Last Controlled Substance Agreement Date: 12/10/12 Last UDS:12/10/12

## 2018-04-02 DIAGNOSIS — Z01419 Encounter for gynecological examination (general) (routine) without abnormal findings: Secondary | ICD-10-CM | POA: Diagnosis not present

## 2018-04-02 DIAGNOSIS — Z130 Encounter for screening for diseases of the blood and blood-forming organs and certain disorders involving the immune mechanism: Secondary | ICD-10-CM | POA: Diagnosis not present

## 2018-04-02 DIAGNOSIS — Z23 Encounter for immunization: Secondary | ICD-10-CM | POA: Diagnosis not present

## 2018-04-02 DIAGNOSIS — Z0000 Encounter for general adult medical examination without abnormal findings: Secondary | ICD-10-CM | POA: Diagnosis not present

## 2018-04-02 DIAGNOSIS — Z1151 Encounter for screening for human papillomavirus (HPV): Secondary | ICD-10-CM | POA: Diagnosis not present

## 2018-04-02 DIAGNOSIS — Z131 Encounter for screening for diabetes mellitus: Secondary | ICD-10-CM | POA: Diagnosis not present

## 2018-04-02 DIAGNOSIS — Z13220 Encounter for screening for lipoid disorders: Secondary | ICD-10-CM | POA: Diagnosis not present

## 2018-04-02 DIAGNOSIS — Z6820 Body mass index (BMI) 20.0-20.9, adult: Secondary | ICD-10-CM | POA: Diagnosis not present

## 2018-04-02 DIAGNOSIS — Z1329 Encounter for screening for other suspected endocrine disorder: Secondary | ICD-10-CM | POA: Diagnosis not present

## 2018-06-25 ENCOUNTER — Other Ambulatory Visit: Payer: Self-pay | Admitting: Family Medicine

## 2018-06-25 MED ORDER — ALPRAZOLAM 1 MG PO TABS
ORAL_TABLET | ORAL | 0 refills | Status: DC
Start: 2018-06-25 — End: 2018-09-18

## 2018-06-25 NOTE — Telephone Encounter (Signed)
See prev message please send in the 1mg   Name of Medication: Xanax Name of Pharmacy: [facility] [address] Henrietta Dine or Written Date and Quantity: 03/26/18 #60 tabs with 0 refills Last Office Visit and Type: 3 month f/u on 11/05/17 Next Office Visit and Type: none scheduled Last Controlled Substance Agreement Date: 12/10/12 Last UDS:12/10/12

## 2018-06-25 NOTE — Telephone Encounter (Signed)
I sent it  Please let her know there are new inst since pill strength is stronger  Thanks

## 2018-06-25 NOTE — Telephone Encounter (Signed)
Pt need refill for Xanax/////1mg  tablet is all they have in stock   [facility]

## 2018-07-05 DIAGNOSIS — J019 Acute sinusitis, unspecified: Secondary | ICD-10-CM | POA: Diagnosis not present

## 2018-07-08 ENCOUNTER — Ambulatory Visit: Payer: BLUE CROSS/BLUE SHIELD | Admitting: Internal Medicine

## 2018-09-18 ENCOUNTER — Other Ambulatory Visit: Payer: Self-pay

## 2018-09-18 ENCOUNTER — Encounter: Payer: Self-pay | Admitting: Family Medicine

## 2018-09-18 ENCOUNTER — Ambulatory Visit: Payer: BLUE CROSS/BLUE SHIELD | Admitting: Family Medicine

## 2018-09-18 VITALS — BP 96/56 | HR 61 | Temp 98.2°F | Ht 67.0 in | Wt 133.2 lb

## 2018-09-18 DIAGNOSIS — R300 Dysuria: Secondary | ICD-10-CM | POA: Diagnosis not present

## 2018-09-18 DIAGNOSIS — N3000 Acute cystitis without hematuria: Secondary | ICD-10-CM | POA: Insufficient documentation

## 2018-09-18 DIAGNOSIS — F419 Anxiety disorder, unspecified: Secondary | ICD-10-CM | POA: Diagnosis not present

## 2018-09-18 LAB — POC URINALSYSI DIPSTICK (AUTOMATED)
Bilirubin, UA: NEGATIVE
Blood, UA: NEGATIVE
GLUCOSE UA: NEGATIVE
Nitrite, UA: NEGATIVE
Protein, UA: NEGATIVE
Urobilinogen, UA: 0.2 E.U./dL
pH, UA: 6 (ref 5.0–8.0)

## 2018-09-18 MED ORDER — ALPRAZOLAM 0.25 MG PO TABS: ORAL_TABLET | ORAL | 0 refills | Status: DC

## 2018-09-18 MED ORDER — SULFAMETHOXAZOLE-TRIMETHOPRIM 800-160 MG PO TABS: 1.0000 | ORAL_TABLET | Freq: Two times a day (BID) | ORAL | 0 refills | Status: DC

## 2018-09-18 NOTE — Assessment & Plan Note (Signed)
Pt's pharmacy was unable to get 0.25 mg xanax (or 0.5) so she was cutting 1 mg into 4ths -they crumble Pharmacy called and they now have her dose in  # 60 sent to pharmacy Prn use-cautioned of sedation/habit

## 2018-09-18 NOTE — Patient Instructions (Addendum)
Drink lots of water  Take the bactrim DS as directed  If worse at point please let me know   Update if not starting to improve in a week or if worsening   We will let you know when the culture comes back   I will send the xanax px also

## 2018-09-18 NOTE — Assessment & Plan Note (Signed)
With dysuria and frequency  Urine is concentrated/has ketones- stressed imp of fluid intake  tx with bacrim ds for 5 d cx pending  Handout given re: utis  inst to alert [place] if symptoms worsen or do not improve in several days

## 2018-09-18 NOTE — Progress Notes (Signed)
Subjective:    Patient ID: Stephanie [name], female    DOB: 08/30/1980, 38 y.o.   MRN: [id number]  HPI Here for burning with urination   Feels like she has a uti  Last one was 1 1/2 years ago   She tried AZO over the counter  Helped for a few days   Symptoms started last Friday  Feels slight burning all the time (worse to urinate)  Frequency/ urgency   No blood in urine  No odor to urine  No chance pregnant   No nausea or fever  No back pain   Trying to drink lots of water   Results for orders placed or performed in visit on 09/18/18  POCT Urinalysis Dipstick (Automated)  Result Value Ref Range   Color, UA Yellow    Clarity, UA Hazy    Glucose, UA Negative Negative   Bilirubin, UA Negative    Ketones, UA 5 mg/dL    Spec Grav, UA >=1.030 (A) 1.010 - 1.025   Blood, UA Negative    pH, UA 6.0 5.0 - 8.0   Protein, UA Negative Negative   Urobilinogen, UA 0.2 0.2 or 1.0 E.U./dL   Nitrite, UA Negative    Leukocytes, UA Small (1+) (A) Negative     Patient Active Problem List   Diagnosis Date Noted  . Acute cystitis 09/18/2018  . Eye swelling, left 08/31/2015  . Screening for lipoid disorders 11/11/2013  . Fatigue 11/11/2013  . PLANTAR FASCIITIS 09/13/2009  . Nocturnal enuresis 11/25/2008  . Anxiety disorder 07/01/2007  . Allergic rhinitis 03/08/2007   Past Medical History:  Diagnosis Date  . Allergy   . Anxiety   . Nonorganic enuresis    nocturnal/lifetime   Past Surgical History:  Procedure Laterality Date  . TONSILLECTOMY     Social History   Tobacco Use  . Smoking status: Never Smoker  . Smokeless tobacco: Never Used  Substance Use Topics  . Alcohol use: No    Alcohol/week: 0.0 standard drinks  . Drug use: No   Family History  Problem Relation Age of Onset  . Cancer Father        Pancreatic  . Cancer Paternal Grandmother        Breast CA   Allergies  Allergen Reactions  . Erythromycin     REACTION: GI UPSET  . Penicillins     REACTION:  HIVES   Current Outpatient Medications on File Prior to Visit  Medication Sig Dispense Refill  . fexofenadine (ALLEGRA) 180 MG tablet Take 180 mg by mouth daily as needed.      . fluticasone (FLONASE) 50 MCG/ACT nasal spray Place 2 sprays into both nostrils daily. 16 g 11  . Multiple Vitamin (MULTIVITAMIN) tablet Take 1 tablet by mouth daily.      Lenard Forth Triphasic (ORTHO TRI-CYCLEN, 28,) 0.18/0.215/0.25 MG-35 MCG TABS Take 1 tablet by mouth daily.      . solifenacin (VESICARE) 10 MG tablet Take 1 tablet (10 mg total) by mouth daily. 90 tablet 3   No current facility-administered medications on file prior to visit.     Review of Systems  Constitutional: Positive for fatigue. Negative for activity change, appetite change and fever.  HENT: Negative for congestion and sore throat.   Eyes: Negative for itching and visual disturbance.  Respiratory: Negative for cough and shortness of breath.   Cardiovascular: Negative for leg swelling.  Gastrointestinal: Negative for abdominal distention, abdominal pain, constipation, diarrhea and nausea.  Endocrine:  Negative for cold intolerance and polydipsia.  Genitourinary: Positive for dysuria, frequency and urgency. Negative for difficulty urinating, flank pain and hematuria.  Musculoskeletal: Negative for myalgias.  Skin: Negative for rash.  Allergic/Immunologic: Negative for immunocompromised state.  Neurological: Negative for dizziness and weakness.  Hematological: Negative for adenopathy.       Objective:   Physical Exam Constitutional:      General: She is not in acute distress.    Appearance: Normal appearance. She is well-developed and normal weight. She is not ill-appearing.     Comments: slim  HENT:     Head: Normocephalic and atraumatic.     Mouth/Throat:     Mouth: Mucous membranes are moist.     Pharynx: Oropharynx is clear.  Eyes:     Conjunctiva/sclera: Conjunctivae normal.     Pupils: Pupils are equal, round,  and reactive to light.  Neck:     Musculoskeletal: Normal range of motion and neck supple.  Cardiovascular:     Rate and Rhythm: Normal rate and regular rhythm.     Heart sounds: Normal heart sounds.  Pulmonary:     Effort: Pulmonary effort is normal.     Breath sounds: Normal breath sounds.  Abdominal:     General: Bowel sounds are normal. There is no distension.     Palpations: Abdomen is soft.     Tenderness: There is abdominal tenderness. There is no rebound.     Comments: No cva tenderness  Mild suprapubic tenderness  Lymphadenopathy:     Cervical: No cervical adenopathy.  Skin:    Findings: No rash.  Neurological:     Mental Status: She is alert.     Coordination: Coordination normal.  Psychiatric:        Mood and Affect: Mood normal.           Assessment & Plan:   Problem List Items Addressed This Visit      Genitourinary   Acute cystitis - Primary    With dysuria and frequency  Urine is concentrated/has ketones- stressed imp of fluid intake  tx with bacrim ds for 5 d cx pending  Handout given re: utis  inst to alert [place] if symptoms worsen or do not improve in several days      Relevant Orders   Urine Culture     Other   Anxiety disorder    Pt's pharmacy was unable to get 0.25 mg xanax (or 0.5) so she was cutting 1 mg into 4ths -they crumble Pharmacy called and they now have her dose in  # 37 sent to pharmacy Prn use-cautioned of sedation/habit       Relevant Medications   ALPRAZolam (XANAX) 0.25 MG tablet    Other Visit Diagnoses    Dysuria       Relevant Orders   POCT Urinalysis Dipstick (Automated) (Completed)

## 2018-09-19 LAB — URINE CULTURE
MICRO NUMBER:: 305910
Result:: NO GROWTH
SPECIMEN QUALITY:: ADEQUATE

## 2018-09-24 ENCOUNTER — Telehealth: Payer: Self-pay

## 2018-09-24 NOTE — Telephone Encounter (Signed)
She does not fit criteria for covid testing.  Doubt flu due to lack of cough and respiratory symptoms currently.  I recommend pushing fluids/rest/ anti fever meds prn  Watch for symptoms of infection anywhere (abd pain /urinary symptoms/cough etc)  If SOB also alert [place]   If no improvement -please make appt to be seen

## 2018-09-24 NOTE — Telephone Encounter (Signed)
Pt notified of Dr. Marliss Coots comments. Pt said she developed a new sxs she wants to tell Dr. Glori Bickers about. Pt said she had red cheeks for about 3 days but she thought it was just from the fever but she just got out the shower and realized she has a rash all over her body, it's on her back, chest, arms and legs, it's little red bumps but they do not itch or hurt she didn't even realize she had the rash until she was showering. Pt doesn't want to schedule an appt (I offered) unless Dr. Glori Bickers thinks it's necessary because she said she came in for a UTI recently and she thinks that's where she got this germ from.

## 2018-09-24 NOTE — Telephone Encounter (Signed)
Pt notified of Dr. Tower's recommendations and verbalized understanding  

## 2018-09-24 NOTE — Telephone Encounter (Signed)
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 On 09/22/18 pt started with fever; pt has had consistent fever of 100.3 but managing with Tylenol. Pt has body aches, chills on and off,and extreme fatigue. No cough,congestion, SOB or S/T. Pt has been staying at her home. Pt wants to know if needs to come in for covid testing; I advised pt is low risk for covid criteria at this time. Pt said she prefers not to come in but wanted to get Dr Marliss Coots advise. I advised may need to come in for flu test; pt said she has no congestion; I advised since pt had flu shot she could have lighter case of flu or it might not be the flu.pt request cb if Dr Glori Bickers thinks pt should be seen or not. [facility].

## 2018-09-24 NOTE — Telephone Encounter (Signed)
She could have a delayed hypersensitivity reaction to the bactrim we gave her (this is a delayed allergic reaction)  Keep an eye on it  There are also lots of viruses that cause rash as well  If symptoms worsen or fail to improve please f/u for eval  Without seeing her I cannot say for sure

## 2018-09-25 ENCOUNTER — Other Ambulatory Visit: Payer: Self-pay

## 2018-09-25 ENCOUNTER — Encounter: Payer: Self-pay | Admitting: Family Medicine

## 2018-09-25 ENCOUNTER — Ambulatory Visit: Payer: BLUE CROSS/BLUE SHIELD | Admitting: Family Medicine

## 2018-09-25 VITALS — BP 100/68 | HR 104 | Temp 98.6°F | Ht 67.0 in | Wt 130.4 lb

## 2018-09-25 DIAGNOSIS — R509 Fever, unspecified: Secondary | ICD-10-CM

## 2018-09-25 DIAGNOSIS — R21 Rash and other nonspecific skin eruption: Secondary | ICD-10-CM | POA: Insufficient documentation

## 2018-09-25 LAB — POCT INFLUENZA A/B
INFLUENZA A, POC: NEGATIVE
Influenza B, POC: NEGATIVE

## 2018-09-25 LAB — POCT RAPID STREP A (OFFICE): Rapid Strep A Screen: NEGATIVE

## 2018-09-25 NOTE — Patient Instructions (Addendum)
Please watch for blisters or lesions /blisters or rash in mouth   No more sulfa antibiotics -in case of an allergy  You may also have a viral exanthem (mostly from kids)   For fever  Tylenol Ibuprofen  Lots of rest and fluids   For discomfort Ibuprofen  Benadryl -it will sedate you but it will help with itching and burning  Keep cool  Itching is worse with warm temp  Stay out of the sun   Strep and flu tests are negative   Update if not starting to improve in a week or if worsening

## 2018-09-25 NOTE — Assessment & Plan Note (Signed)
With rash S/p recent sulfa abx for uti  Disc poss of exanthem or reaction  See a/p for rash

## 2018-09-25 NOTE — Progress Notes (Signed)
Subjective:    Patient ID: Stephanie [name], female    DOB: 1980-07-28, 38 y.o.   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 Fever 09/25/2018  . Acute cystitis 09/18/2018  . Eye swelling, left 08/31/2015  . Screening for lipoid disorders 11/11/2013  . Fatigue 11/11/2013  . PLANTAR FASCIITIS 09/13/2009  . Nocturnal enuresis 11/25/2008  . Anxiety disorder 07/01/2007  . Allergic rhinitis 03/08/2007   Past Medical History:  Diagnosis Date  . Allergy   . Anxiety   . Nonorganic enuresis    nocturnal/lifetime   Past Surgical History:  Procedure Laterality Date  . TONSILLECTOMY     Social History   Tobacco Use  . Smoking status: Never Smoker  . Smokeless tobacco: Never Used  Substance Use Topics  . Alcohol use: No    Alcohol/week: 0.0 standard drinks  . Drug use: No   Family History  Problem Relation Age of Onset  . Cancer Father        Pancreatic  .  Cancer Paternal Grandmother        Breast CA   Allergies  Allergen Reactions  . Erythromycin     REACTION: GI UPSET  . Penicillins     REACTION: HIVES   Current Outpatient Medications on File Prior to Visit  Medication Sig Dispense Refill  . ALPRAZolam (XANAX) 0.25 MG tablet TAKE 1 TO 2 TABLETS BY MOUTH TWICE A DAY AS NEEDED 60 tablet 0  . fexofenadine (ALLEGRA) 180 MG tablet Take 180 mg by mouth daily as needed.      . fluticasone (FLONASE) 50 MCG/ACT nasal spray Place 2 sprays into both nostrils daily. 16 g 11  . Multiple Vitamin (MULTIVITAMIN) tablet Take 1 tablet by mouth daily.      Lenard Forth Triphasic (ORTHO TRI-CYCLEN, 28,) 0.18/0.215/0.25 MG-35 MCG TABS Take 1 tablet by mouth daily.      . solifenacin (VESICARE) 10 MG tablet Take 1 tablet (10 mg total) by mouth daily. 90 tablet 3  . sulfamethoxazole-trimethoprim (BACTRIM DS,SEPTRA DS) 800-160 MG tablet Take 1 tablet by mouth 2 (two) times daily. (Patient not taking: Reported on 09/25/2018) 10 tablet 0   No current  facility-administered medications on file prior to visit.     Review of Systems  Constitutional: Positive for chills, fatigue and fever. Negative for activity change, appetite change and unexpected weight change.  HENT: Negative for congestion, ear pain, rhinorrhea, sinus pressure and sore throat.   Eyes: Negative for pain, redness and visual disturbance.  Respiratory: Negative for cough, shortness of breath and wheezing.   Cardiovascular: Negative for chest pain and palpitations.  Gastrointestinal: Negative for abdominal pain, blood in stool, constipation and diarrhea.  Endocrine: Negative for polydipsia and polyuria.  Genitourinary: Negative for dysuria, frequency and urgency.  Musculoskeletal: Negative for arthralgias, back pain and myalgias.  Skin: Positive for rash. Negative for pallor.  Allergic/Immunologic: Negative for environmental allergies.  Neurological: Negative for dizziness, syncope and  headaches.  Hematological: Negative for adenopathy. Does not bruise/bleed easily.  Psychiatric/Behavioral: Negative for decreased concentration and dysphoric mood. The patient is not nervous/anxious.        Objective:   Physical Exam Constitutional:      General: She is not in acute distress.    Appearance: Normal appearance. She is normal weight. She is not ill-appearing.  HENT:     Head: Normocephalic and atraumatic.     Right Ear: Tympanic membrane and ear canal normal.     Left Ear: Tympanic membrane and ear canal normal.     Nose: Nose normal.     Comments: Boggy nares     Mouth/Throat:     Mouth: Mucous membranes are moist.     Pharynx: Oropharynx is clear. No oropharyngeal exudate or posterior oropharyngeal erythema.     Comments: Mucous membranes are normal  No lesions or ulcers  Eyes:     General: No scleral icterus.       Right eye: No discharge.        Left eye: No discharge.     Extraocular Movements: Extraocular movements intact.     Conjunctiva/sclera: Conjunctivae normal.     Pupils: Pupils are equal, round, and reactive to light.  Neck:     Musculoskeletal: Normal range of motion. No neck rigidity.  Cardiovascular:     Rate and Rhythm: Regular rhythm. Tachycardia present.     Pulses: Normal pulses.     Heart sounds: No murmur.  Pulmonary:     Effort: Pulmonary effort is normal. No respiratory distress.     Breath sounds: Normal breath sounds. No stridor. No wheezing, rhonchi or rales.  Abdominal:     General: Abdomen is flat. Bowel sounds are normal. There is no distension.  Lymphadenopathy:     Cervical: No cervical adenopathy.  Skin:    General: Skin is warm and dry.     Capillary Refill: Capillary refill takes less than 2 seconds.     Coloration: Skin is not pale.     Findings: Rash present. No lesion.     Comments: Red rash-confluent in areas  Worst on trunk and proximal limbs  No blisters or skin interruption  No inv of mucous membranes     Neurological:     Mental Status: She is alert.     Cranial Nerves: No cranial nerve deficit.     Coordination: Coordination normal.     Deep Tendon Reflexes: Reflexes normal.  Psychiatric:        Mood and Affect: Mood normal.           Assessment & Plan:   Problem List Items Addressed This Visit      Musculoskeletal and Integument  Rash - Primary    Red rash trunk and limbs  With fever  Has been exposed to kids Neg strep and flu tests today  No travel or known exp to covid Viral exanthem is possible (discussed fifths dz poss)  Rapid strep negative -doubt scarlet fever  Was recently on abx/sulfa for uti  Possible allergic reaction  susp for stevens-johnson is low due to lack of blisters or oral symptoms (but inst pt to watch for these and alert [place] immediately) Update if not starting to improve in a week or if worsening          Other   Fever    With rash S/p recent sulfa abx for uti  Disc poss of exanthem or reaction  See a/p for rash       Relevant Orders   POCT rapid strep A (Completed)   POCT Influenza A/B (Completed)

## 2018-09-25 NOTE — Assessment & Plan Note (Addendum)
Red rash trunk and limbs  With fever  Has been exposed to kids Neg strep and flu tests today  No travel or known exp to covid Viral exanthem is possible (discussed fifths dz poss)  Rapid strep negative -doubt scarlet fever  Was recently on abx/sulfa for uti  Possible allergic reaction  susp for stevens-johnson is low due to lack of blisters or oral symptoms (but inst pt to watch for these and alert [place] immediately) Update if not starting to improve in a week or if worsening

## 2018-10-02 ENCOUNTER — Other Ambulatory Visit: Payer: Self-pay

## 2018-10-02 ENCOUNTER — Telehealth: Payer: Self-pay | Admitting: *Deleted

## 2018-10-02 ENCOUNTER — Other Ambulatory Visit: Payer: BLUE CROSS/BLUE SHIELD

## 2018-10-02 DIAGNOSIS — R300 Dysuria: Secondary | ICD-10-CM | POA: Diagnosis not present

## 2018-10-02 MED ORDER — NITROFURANTOIN MONOHYD MACRO 100 MG PO CAPS: 100.0000 mg | ORAL_CAPSULE | Freq: Two times a day (BID) | ORAL | 0 refills | Status: DC

## 2018-10-02 NOTE — Telephone Encounter (Signed)
I ordered the urine cx and sent macrobid to her pharmacy

## 2018-10-02 NOTE — Telephone Encounter (Signed)
Patient left a voicemail stating that she has been in twice recently for an  UTI. Patient stated that she had an allergic reaction to the first antibiotic that she was given. Patient stated that she is not sure that she ever got over the UTI because she is now having symptoms again. Patient stated that she may need more antibiotic.  Patient stated that she is having constant burning like before and the symptoms are all the same, no fever. Pharmacy [facility]

## 2018-10-02 NOTE — Telephone Encounter (Signed)
Her urine culture never did grow any bacteria so not sure her burning is from uti or not  She had an allergic rxn to sulfa (so would avoid that)  Can she bring in a urine sample in a sterilized jar or container (shortly after voiding)- so we can get it going for a culture? I can start her empirically on something after that if needed Drink lots of water as well   Let me know when she brings in sample and I will order cx and also send in med  Thanks

## 2018-10-02 NOTE — Telephone Encounter (Signed)
Loetta notified as instructed by telephone.  She will come to office right now to leave a urine sample.

## 2018-10-03 LAB — URINE CULTURE
MICRO NUMBER:: 352555
Result:: NO GROWTH
SPECIMEN QUALITY:: ADEQUATE

## 2018-11-18 ENCOUNTER — Other Ambulatory Visit: Payer: Self-pay | Admitting: Family Medicine

## 2019-01-15 ENCOUNTER — Other Ambulatory Visit: Payer: Self-pay | Admitting: Family Medicine

## 2019-01-30 ENCOUNTER — Other Ambulatory Visit: Payer: Self-pay | Admitting: Family Medicine

## 2019-01-30 NOTE — Telephone Encounter (Signed)
Name of Medication: Xanax Name of Pharmacy: [place] or Written Date and Quantity: 09/18/18 #60 tabs with 0 refills Last Office Visit and Type: acute appt on 09/25/18 Next Office Visit and Type: none scheduled Last Controlled Substance Agreement Date: 12/10/12 Last UDS:12/10/12

## 2019-04-24 ENCOUNTER — Ambulatory Visit (INDEPENDENT_AMBULATORY_CARE_PROVIDER_SITE_OTHER): Payer: BC Managed Care – PPO

## 2019-04-24 DIAGNOSIS — Z23 Encounter for immunization: Secondary | ICD-10-CM | POA: Diagnosis not present

## 2019-05-06 ENCOUNTER — Other Ambulatory Visit: Payer: Self-pay | Admitting: *Deleted

## 2019-05-06 MED ORDER — SOLIFENACIN SUCCINATE 10 MG PO TABS: 10.0000 mg | ORAL_TABLET | Freq: Every day | ORAL | 1 refills | Status: DC

## 2019-05-06 NOTE — Telephone Encounter (Signed)
Pt's had multiple acute appts but no recent or future f/u or CPE scheduled, please advise

## 2019-05-12 ENCOUNTER — Other Ambulatory Visit: Payer: Self-pay | Admitting: Family Medicine

## 2019-05-12 NOTE — Telephone Encounter (Signed)
Name of Medication: Xanax Name of Pharmacy: [place] or Written Date and Quantity: 01/30/19 #60 tabs with 0 refills Last Office Visit and Type: acute appt on 09/25/18 Next Office Visit and Type: none scheduled Last Controlled Substance Agreement Date: 12/10/12 Last UDS:12/10/12

## 2019-05-28 DIAGNOSIS — Z20828 Contact with and (suspected) exposure to other viral communicable diseases: Secondary | ICD-10-CM | POA: Diagnosis not present

## 2019-06-23 ENCOUNTER — Encounter: Payer: Self-pay | Admitting: Family Medicine

## 2019-06-23 DIAGNOSIS — Z01419 Encounter for gynecological examination (general) (routine) without abnormal findings: Secondary | ICD-10-CM | POA: Diagnosis not present

## 2019-06-23 DIAGNOSIS — Z1231 Encounter for screening mammogram for malignant neoplasm of breast: Secondary | ICD-10-CM | POA: Diagnosis not present

## 2019-07-02 ENCOUNTER — Other Ambulatory Visit: Payer: Self-pay

## 2019-07-02 ENCOUNTER — Encounter: Payer: Self-pay | Admitting: Family Medicine

## 2019-07-02 ENCOUNTER — Ambulatory Visit (INDEPENDENT_AMBULATORY_CARE_PROVIDER_SITE_OTHER): Payer: BC Managed Care – PPO | Admitting: Family Medicine

## 2019-07-02 VITALS — BP 98/60 | HR 73 | Temp 96.9°F | Ht 67.0 in | Wt 135.3 lb

## 2019-07-02 DIAGNOSIS — R300 Dysuria: Secondary | ICD-10-CM | POA: Diagnosis not present

## 2019-07-02 DIAGNOSIS — N3000 Acute cystitis without hematuria: Secondary | ICD-10-CM | POA: Diagnosis not present

## 2019-07-02 LAB — POCT UA - MICROSCOPIC ONLY

## 2019-07-02 LAB — POC URINALSYSI DIPSTICK (AUTOMATED)
Bilirubin, UA: NEGATIVE
Blood, UA: NEGATIVE
Glucose, UA: NEGATIVE
Ketones, UA: NEGATIVE
Leukocytes, UA: NEGATIVE
Nitrite, UA: NEGATIVE
Protein, UA: NEGATIVE
Spec Grav, UA: 1.01 (ref 1.010–1.025)
Urobilinogen, UA: 0.2 E.U./dL
pH, UA: 6 (ref 5.0–8.0)

## 2019-07-02 MED ORDER — NITROFURANTOIN MONOHYD MACRO 100 MG PO CAPS: 100.0000 mg | ORAL_CAPSULE | Freq: Two times a day (BID) | ORAL | 0 refills | Status: DC

## 2019-07-02 NOTE — Progress Notes (Signed)
Subjective:    Patient ID: Stephanie [name], female    DOB: [birth year], 38 y.o.   MRN: [id number]  This visit occurred during the SARS-CoV-2 public health emergency.  Safety protocols were in place, including screening questions prior to the visit, additional usage of staff PPE, and extensive cleaning of exam room while observing appropriate contact time as indicated for disinfecting solutions.    HPI Pt presents with urinary symptoms   Yesterday had a burning sensation to urinate  Some blood in urine several times It came on quickly   Frequency - bladder pressure  Drinking lots of water  No urgency   No flank pain  Slight pain in L pelvic area    No fever or nausea   LMP was 12/14 and she is very regular   Thinks she needs to entirely stop drinking soda (just drink water)    In march-urine cultures came back negative   No vaginal symptoms at all  No discharge or bleeding or itching  Results for orders placed or performed in visit on 07/02/19  POCT Urinalysis Dipstick (Automated)  Result Value Ref Range   Color, UA Light Yellow    Clarity, UA Clear    Glucose, UA Negative Negative   Bilirubin, UA Negative    Ketones, UA Negative    Spec Grav, UA 1.010 1.010 - 1.025   Blood, UA Negative    pH, UA 6.0 5.0 - 8.0   Protein, UA Negative Negative   Urobilinogen, UA 0.2 0.2 or 1.0 E.U./dL   Nitrite, UA Negative    Leukocytes, UA Negative Negative  POCT UA - Microscopic Only  Result Value Ref Range   WBC, Ur, HPF, POC 1-3    RBC, urine, microscopic none    Bacteria, U Microscopic few    Mucus, UA few    Epithelial cells, urine per micros few    Crystals, Ur, HPF, POC few    Casts, Ur, LPF, POC none    Yeast, UA none      Patient Active Problem List   Diagnosis Date Noted  . Dysuria 10/02/2018  . Fever 09/25/2018  . Rash 09/25/2018  . Acute cystitis 09/18/2018  . Eye swelling, left 08/31/2015  . Screening for lipoid disorders 11/11/2013  . Fatigue  11/11/2013  . PLANTAR FASCIITIS 09/13/2009  . Nocturnal enuresis 11/25/2008  . Anxiety disorder 07/01/2007  . Allergic rhinitis 03/08/2007   Past Medical History:  Diagnosis Date  . Allergy   . Anxiety   . Nonorganic enuresis    nocturnal/lifetime   Past Surgical History:  Procedure Laterality Date  . TONSILLECTOMY     Social History   Tobacco Use  . Smoking status: Never Smoker  . Smokeless tobacco: Never Used  Substance Use Topics  . Alcohol use: No    Alcohol/week: 0.0 standard drinks  . Drug use: No   Family History  Problem Relation Age of Onset  . Cancer Father        Pancreatic  . Cancer Paternal Grandmother        Breast CA   Allergies  Allergen Reactions  . Erythromycin     REACTION: GI UPSET  . Penicillins     REACTION: HIVES  . Sulfa Antibiotics     rash   Current Outpatient Medications on File Prior to Visit  Medication Sig Dispense Refill  . ALPRAZolam (XANAX) 0.25 MG tablet TAKE 1 TO 2 TABLETS BY MOUTH TWICE A DAY AS NEEDED 60 tablet 0  .  fexofenadine (ALLEGRA) 180 MG tablet Take 180 mg by mouth daily as needed.      . fluticasone (FLONASE) 50 MCG/ACT nasal spray SPRAY 2 SPRAYS INTO EACH NOSTRIL EVERY DAY 48 mL 0  . Multiple Vitamin (MULTIVITAMIN) tablet Take 1 tablet by mouth daily.      Lenard Forth Triphasic (ORTHO TRI-CYCLEN, 28,) 0.18/0.215/0.25 MG-35 MCG TABS Take 1 tablet by mouth daily.      . solifenacin (VESICARE) 10 MG tablet Take 1 tablet (10 mg total) by mouth daily. 90 tablet 1   No current facility-administered medications on file prior to visit.    Review of Systems  Constitutional: Negative for activity change, appetite change, fatigue, fever and unexpected weight change.  HENT: Negative for congestion, ear pain, rhinorrhea, sinus pressure and sore throat.   Eyes: Negative for pain, redness and visual disturbance.  Respiratory: Negative for cough, shortness of breath and wheezing.   Cardiovascular: Negative for chest  pain and palpitations.  Gastrointestinal: Negative for abdominal pain, blood in stool, constipation and diarrhea.  Endocrine: Negative for polydipsia and polyuria.  Genitourinary: Positive for dysuria, frequency, hematuria and pelvic pain. Negative for decreased urine volume, flank pain, menstrual problem, urgency, vaginal bleeding, vaginal discharge and vaginal pain.  Musculoskeletal: Negative for arthralgias, back pain and myalgias.  Skin: Negative for pallor and rash.  Allergic/Immunologic: Negative for environmental allergies.  Neurological: Negative for dizziness, syncope and headaches.  Hematological: Negative for adenopathy. Does not bruise/bleed easily.  Psychiatric/Behavioral: Negative for decreased concentration and dysphoric mood. The patient is not nervous/anxious.        Objective:   Physical Exam Constitutional:      General: She is not in acute distress.    Appearance: She is well-developed.  HENT:     Head: Normocephalic and atraumatic.  Eyes:     Conjunctiva/sclera: Conjunctivae normal.     Pupils: Pupils are equal, round, and reactive to light.  Cardiovascular:     Rate and Rhythm: Normal rate and regular rhythm.     Heart sounds: Normal heart sounds.  Pulmonary:     Effort: Pulmonary effort is normal.     Breath sounds: Normal breath sounds.  Abdominal:     General: Bowel sounds are normal. There is no distension.     Palpations: Abdomen is soft.     Tenderness: There is no abdominal tenderness. There is no rebound.     Comments: No cva tenderness  no suprapubic tenderness or fullness  Musculoskeletal:     Cervical back: Normal range of motion and neck supple.  Lymphadenopathy:     Cervical: No cervical adenopathy.  Skin:    Findings: No rash.  Neurological:     Mental Status: She is alert.           Assessment & Plan:   Problem List Items Addressed This Visit      Genitourinary   Acute cystitis    Unsure if this is cystitis  ua is nl /  (dilute from water intake) Urine micro has 1-3 wbc per field and no rbc  Given her history-will cover with macrobid and wait on a culture         Other   Dysuria - Primary    Fairly neg ua / in setting of drinking soda  Pt also saw blood in urine which we did not   Cover with macrobid while waiting for cx result May need urol ref  inst to drink no beverages but water  Relevant Orders   POCT Urinalysis Dipstick (Automated) (Completed)   Urine Culture   POCT UA - Microscopic Only (Completed)

## 2019-07-02 NOTE — Patient Instructions (Signed)
Keep drinking water Avoid other drinks besides water  Take the macrobid as directed until we get your culture result back   If no uti - we will stop it and consider urology referral   Be mindful of blood in the urine and let us know if this happens again   Also if symptoms worsen

## 2019-07-02 NOTE — Assessment & Plan Note (Signed)
Unsure if this is cystitis  ua is nl / (dilute from water intake) Urine micro has 1-3 wbc per field and no rbc  Given her history-will cover with macrobid and wait on a culture

## 2019-07-03 LAB — URINE CULTURE
MICRO NUMBER:: 1226799
SPECIMEN QUALITY:: ADEQUATE

## 2019-07-03 NOTE — Assessment & Plan Note (Signed)
Fairly neg ua / in setting of drinking soda  Pt also saw blood in urine which we did not   Cover with macrobid while waiting for cx result May need urol ref  inst to drink no beverages but water

## 2019-07-08 DIAGNOSIS — R102 Pelvic and perineal pain: Secondary | ICD-10-CM | POA: Diagnosis not present

## 2019-07-08 DIAGNOSIS — R1032 Left lower quadrant pain: Secondary | ICD-10-CM | POA: Diagnosis not present

## 2019-07-08 DIAGNOSIS — Z3200 Encounter for pregnancy test, result unknown: Secondary | ICD-10-CM | POA: Diagnosis not present

## 2019-07-14 DIAGNOSIS — R102 Pelvic and perineal pain: Secondary | ICD-10-CM | POA: Diagnosis not present

## 2019-07-23 ENCOUNTER — Other Ambulatory Visit: Payer: Self-pay | Admitting: Family Medicine

## 2019-07-23 NOTE — Telephone Encounter (Signed)
Name of Medication:Xanax Name of [facility]acy[facility] or Written Date and Quantity:05/12/19 #60 tabs with 0 refills Last Office Visit and Type:acute appt on 07/02/19 (last f/u was 02/04/18) Next Office Visit and Type:none scheduled Last Controlled Substance Agreement Date:12/10/12 Last UDS:12/10/12

## 2019-09-11 ENCOUNTER — Other Ambulatory Visit: Payer: Self-pay | Admitting: Family Medicine

## 2019-09-16 ENCOUNTER — Other Ambulatory Visit: Payer: Self-pay | Admitting: Family Medicine

## 2019-09-16 NOTE — Telephone Encounter (Signed)
Name of Medication:Xanax Name of [facility]acy[facility] or Written Date and Quantity:07/23/19 #60 tabs with 0 refills Last Office Visit and Type:acute appt on 07/02/19 (last f/u was 02/04/18) Next Office Visit and Type:none scheduled Last Controlled Substance Agreement Date:12/10/12 Last UDS:12/10/12

## 2019-11-20 ENCOUNTER — Other Ambulatory Visit: Payer: Self-pay | Admitting: Family Medicine

## 2019-11-20 NOTE — Telephone Encounter (Signed)
Name of Medication:Xanax Name of Pharmacy[facility] or Written Date and Quantity:09/16/19 #60 tabs with 0 refills Last Office Visit and Type:acute appt on12/23/20 (last f/u was 02/04/18) Next Office Visit and Type:none scheduled Last Controlled Substance Agreement Date:12/10/12 Last UDS:12/10/12

## 2020-01-15 ENCOUNTER — Other Ambulatory Visit: Payer: Self-pay | Admitting: Family Medicine

## 2020-01-15 NOTE — Telephone Encounter (Signed)
Name of Medication:Xanax Name of Pharmacy[facility] or Written Date and Quantity:11/20/19#60 tabs with 0 refills Last Office Visit and Type:acute appt on12/23/20 (last f/u was 02/04/18) Next Office Visit and Type:none scheduled Last Controlled Substance Agreement Date:12/10/12 Last UDS:12/10/12  Vesicare also due for a refill

## 2020-02-18 ENCOUNTER — Ambulatory Visit (INDEPENDENT_AMBULATORY_CARE_PROVIDER_SITE_OTHER): Payer: BC Managed Care – PPO | Admitting: Family Medicine

## 2020-02-18 ENCOUNTER — Encounter: Payer: Self-pay | Admitting: Family Medicine

## 2020-02-18 ENCOUNTER — Other Ambulatory Visit: Payer: Self-pay

## 2020-02-18 ENCOUNTER — Ambulatory Visit (INDEPENDENT_AMBULATORY_CARE_PROVIDER_SITE_OTHER)
Admission: RE | Admit: 2020-02-18 | Discharge: 2020-02-18 | Disposition: A | Discharge status: Home / Self Care | Payer: BC Managed Care – PPO | Source: Ambulatory Visit | Attending: Family Medicine | Admitting: Family Medicine

## 2020-02-18 DIAGNOSIS — R1032 Left lower quadrant pain: Secondary | ICD-10-CM

## 2020-02-18 DIAGNOSIS — R109 Unspecified abdominal pain: Secondary | ICD-10-CM | POA: Diagnosis not present

## 2020-02-18 DIAGNOSIS — G8929 Other chronic pain: Secondary | ICD-10-CM | POA: Insufficient documentation

## 2020-02-18 NOTE — Assessment & Plan Note (Signed)
Intermittent for 9 mo and now more intense No particular triggers H/o slow GI system and constipation in the past  Differential includes diverticular dz, colitis, IBS constipation and less likely malignancy (fam hx of cancer has pt worried)  Labs ordered  abd xr ordered  Enc pt to keep stools moving (stool softeners or miralax)  Also start fiber supplement daily (citrucel or benefiber) and continue good water intake  If no imp may need to consider CT scan

## 2020-02-18 NOTE — Progress Notes (Signed)
Subjective:    Patient ID: Stephanie [name], female    DOB: [birth year], 39 y.o.   MRN: [id number]  This visit occurred during the SARS-CoV-2 public health emergency.  Safety protocols were in place, including screening questions prior to the visit, additional usage of staff PPE, and extensive cleaning of exam room while observing appropriate contact time as indicated for disinfecting solutions.    HPI Pt presents with c/o abdominal pain   Wt Readings from Last 3 Encounters:  02/18/20 132 lb 7 oz (60.1 kg)  07/02/19 135 lb 5 oz (61.4 kg)  09/25/18 130 lb 7 oz (59.2 kg)   20.74 kg/m  For 9 months she has had a sharp pain in the lower L abdomen  Started in December last year  Once it starts it hurts for 2-3 weeks  Waxes and wanes  She had an appt with gyn- exam and [place] - no cyst /normal  Is not very regular  She started a probiotic and prebiotic Also took a stool softener-helped for a little while  No fever No nausea or malaise   Is careful diet wise  Cut back on ice cream  Perhaps junk food makes it a little worse   Drinking lots of water -monitors this  Has increased fruit veggies  Some fiber bars   Not a lot of nuts or seeds   Walks 2-3 mi per day  No bulging in the area/ no h/o hernia    Has a BM every 2-3 days  Sometimes gets bloated or crampy (not like this however)   No colitis or colon cancer in family    fam hx: father had pancreatic cancer at 70 -died  GF- lung cancer  Patient Active Problem List   Diagnosis Date Noted  . Abdominal pain, left lower quadrant 02/18/2020  . Dysuria 10/02/2018  . Fever 09/25/2018  . Rash 09/25/2018  . Acute cystitis 09/18/2018  . Eye swelling, left 08/31/2015  . Screening for lipoid disorders 11/11/2013  . Fatigue 11/11/2013  . PLANTAR FASCIITIS 09/13/2009  . Nocturnal enuresis 11/25/2008  . Anxiety disorder 07/01/2007  . Allergic rhinitis 03/08/2007   Past Medical History:  Diagnosis Date  . Allergy   .  Anxiety   . Nonorganic enuresis    nocturnal/lifetime   Past Surgical History:  Procedure Laterality Date  . TONSILLECTOMY     Social History   Tobacco Use  . Smoking status: Never Smoker  . Smokeless tobacco: Never Used  Substance Use Topics  . Alcohol use: No    Alcohol/week: 0.0 standard drinks  . Drug use: No   Family History  Problem Relation Age of Onset  . Cancer Father        Pancreatic  . Cancer Paternal Grandmother        Breast CA   Allergies  Allergen Reactions  . Erythromycin     REACTION: GI UPSET  . Penicillins     REACTION: HIVES  . Sulfa Antibiotics     rash   Current Outpatient Medications on File Prior to Visit  Medication Sig Dispense Refill  . ALPRAZolam (XANAX) 0.25 MG tablet TAKE 1 TO 2 TABLETS BY MOUTH TWICE A DAY AS NEEDED 60 tablet 1  . fexofenadine (ALLEGRA) 180 MG tablet Take 180 mg by mouth daily as needed.      . fluticasone (FLONASE) 50 MCG/ACT nasal spray SPRAY 2 SPRAYS INTO EACH NOSTRIL EVERY DAY 48 mL 0  . Multiple Vitamin (MULTIVITAMIN) tablet Take 1  tablet by mouth daily.      Marland Kitchen PREBIOTIC PRODUCT PO Take 1 tablet by mouth daily.    . Probiotic Product (PROBIOTIC DAILY PO) Take 1 tablet by mouth daily.    . solifenacin (VESICARE) 10 MG tablet TAKE 1 TABLET BY MOUTH EVERY DAY 90 tablet 1   No current facility-administered medications on file prior to visit.     Review of Systems  Constitutional: Negative for activity change, appetite change, fatigue, fever and unexpected weight change.  HENT: Negative for congestion, ear pain, rhinorrhea, sinus pressure and sore throat.   Eyes: Negative for pain, redness and visual disturbance.  Respiratory: Negative for cough, shortness of breath and wheezing.   Cardiovascular: Negative for chest pain and palpitations.  Gastrointestinal: Positive for abdominal pain and constipation. Negative for abdominal distention, anal bleeding, blood in stool, diarrhea, nausea, rectal pain and vomiting.    Endocrine: Negative for polydipsia and polyuria.  Genitourinary: Negative for dysuria, flank pain, frequency, hematuria and urgency.  Musculoskeletal: Negative for arthralgias, back pain and myalgias.  Skin: Negative for pallor and rash.  Allergic/Immunologic: Negative for environmental allergies.  Neurological: Negative for dizziness, syncope and headaches.  Hematological: Negative for adenopathy. Does not bruise/bleed easily.  Psychiatric/Behavioral: Negative for decreased concentration and dysphoric mood. The patient is not nervous/anxious.        Objective:   Physical Exam Constitutional:      General: She is not in acute distress.    Appearance: She is well-developed and normal weight. She is not ill-appearing or diaphoretic.  HENT:     Head: Normocephalic and atraumatic.  Eyes:     General: No scleral icterus.    Conjunctiva/sclera: Conjunctivae normal.     Pupils: Pupils are equal, round, and reactive to light.  Cardiovascular:     Rate and Rhythm: Normal rate and regular rhythm.     Heart sounds: Normal heart sounds.  Pulmonary:     Effort: Pulmonary effort is normal. No respiratory distress.     Breath sounds: Normal breath sounds. No wheezing or rales.  Abdominal:     General: Abdomen is flat. Bowel sounds are normal. There is no distension or abdominal bruit.     Palpations: Abdomen is soft. There is no hepatomegaly, splenomegaly, mass or pulsatile mass.     Tenderness: There is abdominal tenderness in the left lower quadrant. There is no right CVA tenderness, left CVA tenderness, guarding or rebound. Negative signs include Murphy's sign and McBurney's sign.     Hernia: No hernia is present.     Comments: Mild tenderness LLQ of abdomen  No rebound/gaurding  Not worsened by movement  No M noted    Musculoskeletal:     Cervical back: Normal range of motion and neck supple.  Lymphadenopathy:     Cervical: No cervical adenopathy.  Skin:    General: Skin is warm and  dry.     Coloration: Skin is not jaundiced or pale.     Findings: No erythema or rash.  Neurological:     Mental Status: She is alert.     Motor: No weakness.     Coordination: Coordination normal.  Psychiatric:        Mood and Affect: Mood normal.     Comments: pleasant           Assessment & Plan:   Problem List Items Addressed This Visit      Other   Abdominal pain, left lower quadrant    Intermittent for 9  mo and now more intense No particular triggers H/o slow GI system and constipation in the past  Differential includes diverticular dz, colitis, IBS constipation and less likely malignancy (fam hx of cancer has pt worried)  Labs ordered  abd xr ordered  Enc pt to keep stools moving (stool softeners or miralax)  Also start fiber supplement daily (citrucel or benefiber) and continue good water intake  If no imp may need to consider CT scan      Relevant Orders   CBC with Differential/Platelet   Basic metabolic panel   Hepatic function panel   DG Abd 1 View

## 2020-02-18 NOTE — Patient Instructions (Addendum)
Labs today  Xray today   Keep drinking lots of water  Get citrucel or benefiber and take daily as directed (store brand is fine)  Stool softener or miralax if needed for constipation   For now -on the off chance of diverticulosis - avoid nuts/seeds/corn and popcorn   Call if symptoms suddenly worsen

## 2020-02-19 ENCOUNTER — Telehealth: Payer: Self-pay | Admitting: Family Medicine

## 2020-02-19 DIAGNOSIS — R1032 Left lower quadrant pain: Secondary | ICD-10-CM

## 2020-02-19 LAB — CBC WITH DIFFERENTIAL/PLATELET
Basophils Absolute: 0 10*3/uL (ref 0.0–0.1)
Basophils Relative: 0.5 % (ref 0.0–3.0)
Eosinophils Absolute: 0.1 10*3/uL (ref 0.0–0.7)
Eosinophils Relative: 2.2 % (ref 0.0–5.0)
HCT: 37.5 % (ref 36.0–46.0)
Hemoglobin: 12.5 g/dL (ref 12.0–15.0)
Lymphocytes Relative: 24.5 % (ref 12.0–46.0)
Lymphs Abs: 1.4 10*3/uL (ref 0.7–4.0)
MCHC: 33.3 g/dL (ref 30.0–36.0)
MCV: 90.9 fl (ref 78.0–100.0)
Monocytes Absolute: 0.4 10*3/uL (ref 0.1–1.0)
Monocytes Relative: 7.2 % (ref 3.0–12.0)
Neutro Abs: 3.7 10*3/uL (ref 1.4–7.7)
Neutrophils Relative %: 65.6 % (ref 43.0–77.0)
Platelets: 160 10*3/uL (ref 150.0–400.0)
RBC: 4.12 Mil/uL (ref 3.87–5.11)
RDW: 13.1 % (ref 11.5–15.5)
WBC: 5.6 10*3/uL (ref 4.0–10.5)

## 2020-02-19 LAB — HEPATIC FUNCTION PANEL
ALT: 16 U/L (ref 0–35)
AST: 19 U/L (ref 0–37)
Albumin: 4.1 g/dL (ref 3.5–5.2)
Alkaline Phosphatase: 39 U/L (ref 39–117)
Bilirubin, Direct: 0.1 mg/dL (ref 0.0–0.3)
Total Bilirubin: 0.4 mg/dL (ref 0.2–1.2)
Total Protein: 6.9 g/dL (ref 6.0–8.3)

## 2020-02-19 LAB — BASIC METABOLIC PANEL
BUN: 10 mg/dL (ref 6–23)
CO2: 29 mEq/L (ref 19–32)
Calcium: 9.3 mg/dL (ref 8.4–10.5)
Chloride: 102 mEq/L (ref 96–112)
Creatinine, Ser: 0.85 mg/dL (ref 0.40–1.20)
GFR: 73.56 mL/min (ref >60.00)
Glucose, Bld: 102 mg/dL — ABNORMAL HIGH (ref 70–99)
Potassium: 3.5 mEq/L (ref 3.5–5.1)
Sodium: 137 mEq/L (ref 135–145)

## 2020-02-19 NOTE — Telephone Encounter (Signed)
Order done, will route to [facility]

## 2020-02-19 NOTE — Telephone Encounter (Signed)
-----   Message from Tammi Sou, [place] sent at 02/19/2020  2:22 PM EDT ----- Pt notified of xray results and Dr. Marliss Coots comments. Pt agrees with CT order she would like to have it done in [place], I advise pt our [facility] will call to schedule appt

## 2020-02-20 NOTE — Telephone Encounter (Signed)
Noted. This is on my scheduled orders to do today

## 2020-02-24 ENCOUNTER — Other Ambulatory Visit: Payer: Self-pay

## 2020-02-24 ENCOUNTER — Ambulatory Visit
Admission: RE | Admit: 2020-02-24 | Discharge: 2020-02-24 | Disposition: A | Discharge status: Home / Self Care | Payer: BC Managed Care – PPO | Source: Ambulatory Visit | Attending: Family Medicine | Admitting: Family Medicine

## 2020-02-24 ENCOUNTER — Ambulatory Visit: Admission: RE | Admit: 2020-02-24 | Payer: BC Managed Care – PPO | Source: Ambulatory Visit

## 2020-02-24 DIAGNOSIS — R1032 Left lower quadrant pain: Secondary | ICD-10-CM | POA: Diagnosis not present

## 2020-02-24 DIAGNOSIS — N200 Calculus of kidney: Secondary | ICD-10-CM | POA: Diagnosis not present

## 2020-02-24 MED ORDER — IOHEXOL 300 MG/ML  SOLN
80.0000 mL | Freq: Once | INTRAMUSCULAR | Status: AC | PRN
  Administered 2020-02-24: 80 mL via INTRAVENOUS

## 2020-02-27 ENCOUNTER — Telehealth: Payer: Self-pay | Admitting: Family Medicine

## 2020-02-27 DIAGNOSIS — R1032 Left lower quadrant pain: Secondary | ICD-10-CM

## 2020-02-27 NOTE — Telephone Encounter (Signed)
Referral to GI done Will route to [facility]

## 2020-02-27 NOTE — Telephone Encounter (Signed)
Pt called wanting to get results of ct scan

## 2020-02-27 NOTE — Telephone Encounter (Signed)
-----   Message from Tammi Sou, [place] sent at 02/27/2020 10:10 AM EDT ----- Pt notified of CT results and Dr. Marliss Coots comments. Pt said her pain and sxs are the exact same she hasn't had any improvement in them at all, pt ask what should she do, please advise

## 2020-02-27 NOTE — Telephone Encounter (Signed)
Addressed through result notes  

## 2020-02-27 NOTE — Telephone Encounter (Signed)
Pt notified of Dr. Marliss Coots comments and instructions. Pt agrees with GI referral she would like to see someone in [place] if possible. I advise pt PCP will put referral in and our [facility] will call to schedule appt

## 2020-02-27 NOTE — Telephone Encounter (Signed)
Keep treating the constipation  I want to place a GI ref  Does she pref [place] or [place]?

## 2020-03-09 ENCOUNTER — Other Ambulatory Visit: Payer: Self-pay

## 2020-03-10 ENCOUNTER — Other Ambulatory Visit: Payer: Self-pay

## 2020-03-10 ENCOUNTER — Encounter: Payer: Self-pay | Admitting: Gastroenterology

## 2020-03-10 ENCOUNTER — Ambulatory Visit: Payer: BC Managed Care – PPO | Admitting: Gastroenterology

## 2020-03-10 VITALS — BP 103/75 | HR 81 | Temp 98.1°F | Ht 67.0 in | Wt 130.1 lb

## 2020-03-10 DIAGNOSIS — R1032 Left lower quadrant pain: Secondary | ICD-10-CM

## 2020-03-10 DIAGNOSIS — G8929 Other chronic pain: Secondary | ICD-10-CM

## 2020-03-10 NOTE — Progress Notes (Signed)
Cephas Darby, MD [address]  [place]  [place], [place] [address]  Main: [phone]  Fax: [phone]    Gastroenterology Consultation  Referring Provider:     Abner Greenspan, MD Primary Care Physician:  Tower, Wynelle Fanny, MD Primary Gastroenterologist:  Dr. Cephas Darby Reason for Consultation:     Chronic left lower quadrant pain        HPI:   Stephanie [name] is a 39 y.o. female referred by Dr. Glori Bickers, Wynelle Fanny, MD  for consultation & management of chronic left lower quadrant pain.  Patient reports that she has been experiencing approximately 10 months of left lower quadrant pain which is mild to moderate in intensity, more or less constant.  She also has history of severe constipation, frequency about once a week associated with significant straining and hard stool.  For last 2 weeks, patient says her pain has somewhat improved with improvement in her bowel movements after she started taking Citrucel.  This was started by her PCP.  Patient has underwent CT abdomen pelvis with contrast which revealed nonobstructing left-sided nephrolithiasis which is not thought to be contributing to her left lower quadrant pain.  Patient denies rectal bleeding, abdominal bloating, weight loss.  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 Allergy   . Anxiety   . Nonorganic enuresis    nocturnal/lifetime    Past Surgical History:  Procedure Laterality Date  . TONSILLECTOMY      Current Outpatient Medications:  .  ALPRAZolam (XANAX) 0.25 MG tablet, TAKE 1 TO 2 TABLETS BY MOUTH TWICE A DAY AS NEEDED, Disp: 60 tablet, Rfl: 1 .  fexofenadine (ALLEGRA) 180 MG tablet, Take 180 mg by mouth daily as needed.  , Disp: , Rfl:  .  fluticasone (FLONASE) 50 MCG/ACT nasal spray,  SPRAY 2 SPRAYS INTO EACH NOSTRIL EVERY DAY, Disp: 48 mL, Rfl: 0 .  Methylcellulose, Laxative, (CITRUCEL PO), Take by mouth., Disp: , Rfl:  .  PREBIOTIC PRODUCT PO, Take 1 tablet by mouth daily., Disp: , Rfl:  .  Probiotic Product (PROBIOTIC DAILY PO), Take 1 tablet by mouth daily., Disp: , Rfl:  .  solifenacin (VESICARE) 10 MG tablet, TAKE 1 TABLET BY MOUTH EVERY DAY, Disp: 90 tablet, Rfl: 1   Family History  Problem Relation Age of Onset  . Cancer Father        Pancreatic  . Cancer Paternal Grandmother        Breast CA     Social History   Tobacco Use  . Smoking status: Never Smoker  . Smokeless tobacco: Never Used  Substance Use Topics  . Alcohol use: No    Alcohol/week: 0.0 standard drinks  . Drug use: No    Allergies as of 03/10/2020 - Review Complete 03/10/2020  Allergen Reaction Noted  . Erythromycin    . Penicillins    . Sulfa antibiotics  09/25/2018    Review of Systems:    All systems reviewed and negative except where noted in HPI.   Physical Exam:  BP 103/75 (BP Location: Left Arm, Patient Position: Sitting, Cuff Size: Normal)   Pulse 81   Temp 98.1 F (36.7 C) (Oral)   Ht 5\' 7"  (1.702 m)   Wt 130 lb 2 oz (59 kg)  LMP 02/18/2020   BMI 20.38 kg/m  Patient's last menstrual period was 02/18/2020.  General:   Alert,  Well-developed, well-nourished, pleasant and cooperative in NAD Head:  Normocephalic and atraumatic. Eyes:  Sclera clear, no icterus.   Conjunctiva pink. Ears:  Normal auditory acuity. Nose:  No deformity, discharge, or lesions. Mouth:  No deformity or lesions,oropharynx pink & moist. Neck:  Supple; no masses or thyromegaly. Lungs:  Respirations even and unlabored.  Clear throughout to auscultation.   No wheezes, crackles, or rhonchi. No acute distress. Heart:  Regular rate and rhythm; no murmurs, clicks, rubs, or gallops. Abdomen:  Normal bowel sounds. Soft, non-tender and non-distended without masses, hepatosplenomegaly or hernias noted.   No guarding or rebound tenderness.   Rectal: Not performed Msk:  Symmetrical without gross deformities. Good, equal movement & strength bilaterally. Pulses:  Normal pulses noted. Extremities:  No clubbing or edema.  No cyanosis. Neurologic:  Alert and oriented x3;  grossly normal neurologically. Skin:  Intact without significant lesions or rashes. No jaundice. Psych:  Alert and cooperative. Normal mood and affect.  Imaging Studies: Reviewed  Assessment and Plan:   Stephanie [name] is a 45 y.o. pleasant Caucasian female with no significant past medical history is seen in consultation for chronic left lower quadrant pain and constipation.  Labs and imaging studies are unremarkable  Chronic left lower quadrant pain most likely secondary to severe constipation Discussed about high-fiber diet, information provided Adequate intake of water continue fiber supplements as she is doing Recommend flexible sigmoidoscopy given her age to rule out any structural lesions   Follow up in 3 months   Cephas Darby, MD

## 2020-03-10 NOTE — Patient Instructions (Signed)

## 2020-04-13 ENCOUNTER — Other Ambulatory Visit: Payer: Self-pay

## 2020-04-13 ENCOUNTER — Other Ambulatory Visit
Admission: RE | Admit: 2020-04-13 | Discharge: 2020-04-13 | Disposition: A | Discharge status: Home / Self Care | Payer: BC Managed Care – PPO | Source: Ambulatory Visit | Attending: Gastroenterology | Admitting: Gastroenterology

## 2020-04-13 DIAGNOSIS — Z20822 Contact with and (suspected) exposure to covid-19: Secondary | ICD-10-CM | POA: Diagnosis not present

## 2020-04-13 DIAGNOSIS — Z01812 Encounter for preprocedural laboratory examination: Secondary | ICD-10-CM | POA: Diagnosis not present

## 2020-04-13 LAB — SARS CORONAVIRUS 2 (TAT 6-24 HRS): SARS Coronavirus 2: NEGATIVE

## 2020-04-14 ENCOUNTER — Encounter: Payer: Self-pay | Admitting: Gastroenterology

## 2020-04-15 ENCOUNTER — Encounter
Admission: RE | Disposition: A | Discharge status: Home / Self Care | Payer: Self-pay | Source: Home / Self Care | Attending: Gastroenterology

## 2020-04-15 ENCOUNTER — Ambulatory Visit
Admission: RE | Admit: 2020-04-15 | Discharge: 2020-04-15 | Disposition: A | Discharge status: Home / Self Care | Payer: BC Managed Care – PPO | Attending: Gastroenterology | Admitting: Gastroenterology

## 2020-04-15 ENCOUNTER — Encounter: Payer: Self-pay | Admitting: Gastroenterology

## 2020-04-15 ENCOUNTER — Ambulatory Visit: Payer: BC Managed Care – PPO | Admitting: Anesthesiology

## 2020-04-15 DIAGNOSIS — F419 Anxiety disorder, unspecified: Secondary | ICD-10-CM | POA: Diagnosis not present

## 2020-04-15 DIAGNOSIS — G8929 Other chronic pain: Secondary | ICD-10-CM | POA: Diagnosis not present

## 2020-04-15 DIAGNOSIS — R1032 Left lower quadrant pain: Secondary | ICD-10-CM | POA: Diagnosis not present

## 2020-04-15 DIAGNOSIS — Z79899 Other long term (current) drug therapy: Secondary | ICD-10-CM | POA: Insufficient documentation

## 2020-04-15 HISTORY — PX: FLEXIBLE SIGMOIDOSCOPY: SHX5431

## 2020-04-15 LAB — POCT PREGNANCY, URINE: Preg Test, Ur: NEGATIVE

## 2020-04-15 SURGERY — SIGMOIDOSCOPY, FLEXIBLE
Anesthesia: General

## 2020-04-15 MED ORDER — MIDAZOLAM HCL 2 MG/2ML IJ SOLN
INTRAMUSCULAR | Status: AC
  Filled 2020-04-15: qty 2

## 2020-04-15 MED ORDER — PROPOFOL 10 MG/ML IV BOLUS
INTRAVENOUS | Status: DC | PRN
  Administered 2020-04-15: 70 mg via INTRAVENOUS

## 2020-04-15 MED ORDER — PROPOFOL 500 MG/50ML IV EMUL
INTRAVENOUS | Status: DC | PRN
  Administered 2020-04-15: 150 ug/kg/min via INTRAVENOUS

## 2020-04-15 MED ORDER — SODIUM CHLORIDE 0.9 % IV SOLN: INTRAVENOUS | Status: DC

## 2020-04-15 NOTE — H&P (Signed)
Stephanie Darby, MD [address]  [place]  [place], [place] [address]  Main: [phone]  Fax: [phone] Pager: [phone]  Primary Care Physician:  [name], Stephanie Fanny, MD Primary Gastroenterologist:  Dr. Cephas [name]  Pre-Procedure History & Physical: HPI:  Stephanie [name] is a 39 y.o. female is here for an flexible sigmoidoscopy.   Past Medical History:  Diagnosis Date  . Allergy   . Anxiety   . Nonorganic enuresis    nocturnal/lifetime    Past Surgical History:  Procedure Laterality Date  . TONSILLECTOMY      Prior to Admission medications   Medication Sig Start Date End Date Taking? Authorizing Provider  ALPRAZolam (XANAX) 0.25 MG tablet TAKE 1 TO 2 TABLETS BY MOUTH TWICE A DAY AS NEEDED 01/15/20   [name], Stephanie Fanny, MD  fexofenadine (ALLEGRA) 180 MG tablet Take 180 mg by mouth daily as needed.      [provider]  fluticasone (FLONASE) 50 MCG/ACT nasal spray SPRAY 2 SPRAYS INTO EACH NOSTRIL EVERY DAY 01/16/19   [name], Stephanie Fanny, MD  Methylcellulose, Laxative, (CITRUCEL PO) Take by mouth.    [provider]  PREBIOTIC PRODUCT PO Take 1 tablet by mouth daily.    [provider]  Probiotic Product (PROBIOTIC DAILY PO) Take 1 tablet by mouth daily.    [provider]  solifenacin (VESICARE) 10 MG tablet TAKE 1 TABLET BY MOUTH EVERY DAY 01/15/20   [name], Stephanie Fanny, MD    Allergies as of 03/10/2020 - Review Complete 03/10/2020  Allergen Reaction Noted  . Erythromycin    . Penicillins    . Sulfa antibiotics  09/25/2018    Family History  Problem Relation Age of Onset  . Cancer Father        Pancreatic  . Cancer Paternal Grandmother        Breast CA    Social History   Socioeconomic History  . Marital status: Married    Spouse name: Not on file  . Number of children: Not on file  . Years of education: Not on file  . Highest education level: Not on file  Occupational History  . Occupation: [employer]  Tobacco Use  . Smoking  status: Never Smoker  . Smokeless tobacco: Never Used  Substance and Sexual Activity  . Alcohol use: No    Alcohol/week: 0.0 standard drinks  . Drug use: No  . Sexual activity: Not on file  Other Topics Concern  . Not on file  Social History Narrative  . Not on file   Social Determinants of Health   Financial Resource Strain:   . Difficulty of Paying Living Expenses: Not on file  Food Insecurity:   . Worried About Charity fundraiser in the Last Year: Not on file  . Ran Out of Food in the Last Year: Not on file  Transportation Needs:   . Lack of Transportation (Medical): Not on file  . Lack of Transportation (Non-Medical): Not on file  Physical Activity:   . Days of Exercise per Week: Not on file  . Minutes of Exercise per Session: Not on file  Stress:   . Feeling of Stress : Not on file  Social Connections:   . Frequency of Communication with Friends and Family: Not on file  . Frequency of Social Gatherings with Friends and Family: Not on file  . Attends Religious Services: Not on file  . Active Member of Clubs or Organizations: Not on file  .  Attends Archivist Meetings: Not on file  . Marital Status: Not on file  Intimate Partner Violence:   . Fear of Current or Ex-Partner: Not on file  . Emotionally Abused: Not on file  . Physically Abused: Not on file  . Sexually Abused: Not on file    Review of Systems: See HPI, otherwise negative ROS  Physical Exam: BP 94/77   Pulse 97   Temp (!) 97 F (36.1 C) (Temporal)   Resp 17   Ht 5\' 6"  (1.676 m)   Wt 58.1 kg   SpO2 100%   BMI 20.66 kg/m  General:   Alert,  pleasant and cooperative in NAD Head:  Normocephalic and atraumatic. Neck:  Supple; no masses or thyromegaly. Lungs:  Clear throughout to auscultation.    Heart:  Regular rate and rhythm. Abdomen:  Soft, nontender and nondistended. Normal bowel sounds, without guarding, and without rebound.   Neurologic:  Alert and  oriented x4;  grossly normal  neurologically.  Impression/Plan: Stephanie [name] is here for an flexible sigmoidoscopy to be performed for LLQ pain  Risks, benefits, limitations, and alternatives regarding  flexible sigmoidoscopy have been reviewed with the patient.  Questions have been answered.  All parties agreeable.   Sherri Sear, MD  04/15/2020, 10:58 AM

## 2020-04-15 NOTE — Anesthesia Preprocedure Evaluation (Signed)
Anesthesia Evaluation  Patient identified by MRN, date of birth, ID band Patient awake    Reviewed: Allergy & Precautions, H&P , NPO status , Patient's Chart, lab work & pertinent test results  History of Anesthesia Complications Negative for: history of anesthetic complications  Airway Mallampati: II  TM Distance: >3 FB Neck ROM: full    Dental  (+) Chipped   Pulmonary neg pulmonary ROS, neg shortness of breath,    Pulmonary exam normal        Cardiovascular (-) angina(-) Past MI and (-) DOE negative cardio ROS Normal cardiovascular exam     Neuro/Psych PSYCHIATRIC DISORDERS negative neurological ROS     GI/Hepatic negative GI ROS, Neg liver ROS, neg GERD  ,  Endo/Other  negative endocrine ROS  Renal/GU negative Renal ROS  negative genitourinary   Musculoskeletal   Abdominal   Peds  Hematology negative hematology ROS (+)   Anesthesia Other Findings Past Medical History: No date: Allergy No date: Anxiety No date: Nonorganic enuresis     Comment:  nocturnal/lifetime  Past Surgical History: No date: TONSILLECTOMY  BMI    Body Mass Index: 20.66 kg/m      Reproductive/Obstetrics negative OB ROS                             Anesthesia Physical Anesthesia Plan  ASA: II  Anesthesia Plan: General   Post-op Pain Management:    Induction: Intravenous  PONV Risk Score and Plan: Propofol infusion and TIVA  Airway Management Planned: Natural Airway and Nasal Cannula  Additional Equipment:   Intra-op Plan:   Post-operative Plan:   Informed Consent: I have reviewed the patients History and Physical, chart, labs and discussed the procedure including the risks, benefits and alternatives for the proposed anesthesia with the patient or authorized representative who has indicated his/her understanding and acceptance.     Dental Advisory Given  Plan Discussed with:  Anesthesiologist, CRNA and Surgeon  Anesthesia Plan Comments: (Patient consented for risks of anesthesia including but not limited to:  - adverse reactions to medications - risk of intubation if required - damage to eyes, teeth, lips or other oral mucosa - nerve damage due to positioning  - sore throat or hoarseness - Damage to heart, brain, nerves, lungs, other parts of body or loss of life  Patient voiced understanding.)        Anesthesia Quick Evaluation

## 2020-04-15 NOTE — Op Note (Signed)
[facility] Gastroenterology Patient Name: Stephanie [name] Procedure Date: 04/15/2020 11:44 AM MRN: [id number] Account #: [id number] Date of Birth: 07-05-1981 Admit Type: Outpatient Age: 39 Room: [facility] ENDO ROOM 1 Gender: Female Note Status: Finalized Procedure:             Flexible Sigmoidoscopy Indications:           Abdominal pain in the left lower quadrant Providers:             Lin Landsman MD, MD Referring MD:          Wynelle Fanny. Tower (Referring MD) Medicines:             Propofol per Anesthesia Complications:         No immediate complications. Estimated blood loss: None. Procedure:             Pre-Anesthesia Assessment:                        - Prior to the procedure, a History and Physical was                         performed, and patient medications and allergies were                         reviewed. The patient is competent. The risks and                         benefits of the procedure and the sedation options and                         risks were discussed with the patient. All questions                         were answered and informed consent was obtained.                         Patient identification and proposed procedure were                         verified by the physician, the nurse, the                         anesthesiologist, the anesthetist and the technician                         in the pre-procedure area in the procedure room in the                         endoscopy suite. Mental Status Examination: alert and                         oriented. Airway Examination: normal oropharyngeal                         airway and neck mobility. Respiratory Examination:                         clear to auscultation. CV Examination: normal.  Prophylactic Antibiotics: The patient does not require                         prophylactic antibiotics. Prior Anticoagulants: The                         patient has taken no  previous anticoagulant or                         antiplatelet agents. ASA Grade Assessment: II - A                         patient with mild systemic disease. After reviewing                         the risks and benefits, the patient was deemed in                         satisfactory condition to undergo the procedure. The                         anesthesia plan was to use monitored anesthesia care                         (MAC). Immediately prior to administration of                         medications, the patient was re-assessed for adequacy                         to receive sedatives. The heart rate, respiratory                         rate, oxygen saturations, blood pressure, adequacy of                         pulmonary ventilation, and response to care were                         monitored throughout the procedure. The physical                         status of the patient was re-assessed after the                         procedure.                        After obtaining informed consent, the scope was passed                         under direct vision. The Endoscope was introduced                         through the anus and advanced to the the left                         transverse colon. The flexible sigmoidoscopy was  performed with moderate difficulty due to significant                         looping. Successful completion of the procedure was                         aided by applying abdominal pressure. The patient                         tolerated the procedure well. The quality of the bowel                         preparation was good. Findings:      The perianal and digital rectal examinations were normal. Pertinent       negatives include normal sphincter tone and no palpable rectal lesions.      The entire examined colon appeared normal.      Normal retroflexion in rectum Impression:            - The entire examined colon is normal.                         - No specimens collected. Recommendation:        - Discharge patient to home (with escort).                        - Resume previous diet today.                        - Return to my office as previously scheduled. Procedure Code(s):     --- Professional ---                        [phone], Sigmoidoscopy, flexible; diagnostic, including                         collection of specimen(s) by brushing or washing, when                         performed (separate procedure) Diagnosis Code(s):     --- Professional ---                        R10.32, Left lower quadrant pain CPT copyright 2019 American Medical Association. All rights reserved. The codes documented in this report are preliminary and upon coder review may  be revised to meet current compliance requirements. Dr. Ulyess Mort Lin Landsman MD, MD 04/15/2020 12:12:39 PM This report has been signed electronically. Number of Addenda: 0 Note Initiated On: 04/15/2020 11:44 AM Total Procedure Duration: 0 hours 7 minutes 30 seconds  Estimated Blood Loss:  Estimated blood loss: none.      [facility]

## 2020-04-15 NOTE — Transfer of Care (Signed)
Immediate Anesthesia Transfer of Care Note  Patient: Stephanie [name]  Procedure(s) Performed: FLEXIBLE SIGMOIDOSCOPY (N/A )  Patient Location: PACU  Anesthesia Type:General  Level of Consciousness: sedated  Airway & Oxygen Therapy: Patient Spontanous Breathing  Post-op Assessment: Report given to RN and Post -op Vital signs reviewed and stable  Post vital signs: Reviewed and stable  Last Vitals:  Vitals Value Taken Time  BP 87/66 04/15/20 1205  Temp 36.4 C 04/15/20 1204  Pulse 82 04/15/20 1205  Resp 19 04/15/20 1205  SpO2 100 % 04/15/20 1205  Vitals shown include unvalidated device data.  Last Pain:  Vitals:   04/15/20 1204  TempSrc: Temporal  PainSc: Asleep         Complications: No complications documented.

## 2020-04-15 NOTE — Anesthesia Postprocedure Evaluation (Signed)
Anesthesia Post Note  Patient: Stephanie [name]  Procedure(s) Performed: FLEXIBLE SIGMOIDOSCOPY (N/A )  Patient location during evaluation: Endoscopy Anesthesia Type: General Level of consciousness: awake and alert Pain management: pain level controlled Vital Signs Assessment: post-procedure vital signs reviewed and stable Respiratory status: spontaneous breathing, nonlabored ventilation, respiratory function stable and patient connected to nasal cannula oxygen Cardiovascular status: blood pressure returned to baseline and stable Postop Assessment: no apparent nausea or vomiting Anesthetic complications: no   No complications documented.   Last Vitals:  Vitals:   04/15/20 1224 04/15/20 1234  BP: 96/66 92/68  Pulse: 68 66  Resp: 12 15  Temp:    SpO2: 100% 100%    Last Pain:  Vitals:   04/15/20 1234  TempSrc:   PainSc: 0-No pain                 Precious Haws Bertram Haddix

## 2020-04-16 ENCOUNTER — Encounter: Payer: Self-pay | Admitting: Gastroenterology

## 2020-05-26 ENCOUNTER — Other Ambulatory Visit: Payer: Self-pay | Admitting: Family Medicine

## 2020-05-27 NOTE — Telephone Encounter (Signed)
Name of Medication:Xanax Name of Pharmacy:[facility] or Written Date and Quantity:01/15/20#60 tabs with 1 refills Last Office Visit and Type:acute appt on8/11/21 Next Office Visit and Type:none scheduled Last Controlled Substance Agreement Date:12/10/12 Last UDS:12/10/12

## 2020-06-07 ENCOUNTER — Other Ambulatory Visit: Payer: Self-pay

## 2020-06-10 ENCOUNTER — Encounter: Payer: Self-pay | Admitting: Gastroenterology

## 2020-06-10 ENCOUNTER — Ambulatory Visit: Payer: BC Managed Care – PPO | Admitting: Gastroenterology

## 2020-06-10 ENCOUNTER — Other Ambulatory Visit: Payer: Self-pay

## 2020-06-10 VITALS — BP 101/68 | HR 80 | Temp 97.8°F | Ht 67.0 in | Wt 134.4 lb

## 2020-06-10 DIAGNOSIS — K5909 Other constipation: Secondary | ICD-10-CM | POA: Diagnosis not present

## 2020-06-10 DIAGNOSIS — G8929 Other chronic pain: Secondary | ICD-10-CM | POA: Diagnosis not present

## 2020-06-10 DIAGNOSIS — R1032 Left lower quadrant pain: Secondary | ICD-10-CM | POA: Diagnosis not present

## 2020-06-10 NOTE — Progress Notes (Signed)
Cephas Darby, MD [address]  [place]  [place], [place] [address]  Main: [phone]  Fax: [phone]    Gastroenterology Consultation  Referring Provider:     Abner Greenspan, MD Primary Care Physician:  Tower, Wynelle Fanny, MD Primary Gastroenterologist:  Dr. Cephas Darby Reason for Consultation:     Chronic left lower quadrant pain        HPI:   Stephanie [name] is a 39 y.o. female referred by Dr. Glori Bickers, Wynelle Fanny, MD  for consultation & management of chronic left lower quadrant pain.  Patient reports that she has been experiencing approximately 10 months of left lower quadrant pain which is mild to moderate in intensity, more or less constant.  She also has history of severe constipation, frequency about once a week associated with significant straining and hard stool.  For last 2 weeks, patient says her pain has somewhat improved with improvement in her bowel movements after she started taking Citrucel.  This was started by her PCP.  Patient has underwent CT abdomen pelvis with contrast which revealed nonobstructing left-sided nephrolithiasis which is not thought to be contributing to her left lower quadrant pain.  Patient denies rectal bleeding, abdominal bloating, weight loss.  She is active, is a Oncologist  Labs are unremarkable, no evidence of anemia  Follow-up visit 06/10/20 Patient reports feeling significantly better with regards to left lower quadrant pain and constipation.  She is following healthy diet, more fiber as well as fiber supplements.  Flexible sigmoidoscopy was unremarkable.  She mentioned about her life insurance premium doubling due to her cholesterol being very low at 117  NSAIDs: None  Antiplts/Anticoagulants/Anti thrombotics: None  GI Procedures:  Sigmoidoscopy 04/15/2020 - The entire examined colon is normal. - No specimens collected.  She denies family history of GI malignancy  Past Medical History:  Diagnosis Date  . Allergy   .  Anxiety   . Nonorganic enuresis    nocturnal/lifetime    Past Surgical History:  Procedure Laterality Date  . FLEXIBLE SIGMOIDOSCOPY N/A 04/15/2020   Procedure: FLEXIBLE SIGMOIDOSCOPY;  Surgeon: Lin Landsman, MD;  Location: [facility] ENDOSCOPY;  Service: Gastroenterology;  Laterality: N/A;  . TONSILLECTOMY      Current Outpatient Medications:  .  ALPRAZolam (XANAX) 0.25 MG tablet, TAKE 1 TO 2 TABLETS BY MOUTH TWICE A DAY AS NEEDED, Disp: 60 tablet, Rfl: 1 .  fexofenadine (ALLEGRA) 180 MG tablet, Take 180 mg by mouth daily as needed.  , Disp: , Rfl:  .  fluticasone (FLONASE) 50 MCG/ACT nasal spray, SPRAY 2 SPRAYS INTO EACH NOSTRIL EVERY DAY, Disp: 48 mL, Rfl: 0 .  Methylcellulose, Laxative, (CITRUCEL PO), Take by mouth., Disp: , Rfl:  .  PREBIOTIC PRODUCT PO, Take 1 tablet by mouth daily., Disp: , Rfl:  .  Probiotic Product (PROBIOTIC DAILY PO), Take 1 tablet by mouth daily., Disp: , Rfl:  .  solifenacin (VESICARE) 10 MG tablet, TAKE 1 TABLET BY MOUTH EVERY DAY, Disp: 90 tablet, Rfl: 1 .  TRI-ESTARYLLA 0.18/0.215/0.25 MG-35 MCG tablet, Take 1 tablet by mouth daily., Disp: , Rfl:    Family History  Problem Relation Age of Onset  . Cancer Father        Pancreatic  . Cancer Paternal Grandmother        Breast CA     Social History   Tobacco Use  . Smoking status: Never Smoker  . Smokeless tobacco: Never Used  Substance Use Topics  .  Alcohol use: No    Alcohol/week: 0.0 standard drinks  . Drug use: No    Allergies as of 06/10/2020 - Review Complete 06/10/2020  Allergen Reaction Noted  . Erythromycin    . Penicillins    . Sulfa antibiotics  09/25/2018    Review of Systems:    All systems reviewed and negative except where noted in HPI.   Physical Exam:  BP 101/68 (BP Location: Left Arm, Patient Position: Sitting, Cuff Size: Normal)   Pulse 80   Temp 97.8 F (36.6 C) (Oral)   Ht 5\' 7"  (1.702 m)   Wt 134 lb 6 oz (61 kg)   BMI 21.05 kg/m  No LMP  recorded.  General:   Alert,  Well-developed, well-nourished, pleasant and cooperative in NAD Head:  Normocephalic and atraumatic. Eyes:  Sclera clear, no icterus.   Conjunctiva pink. Ears:  Normal auditory acuity. Nose:  No deformity, discharge, or lesions. Mouth:  No deformity or lesions,oropharynx pink & moist. Neck:  Supple; no masses or thyromegaly. Lungs:  Respirations even and unlabored.  Clear throughout to auscultation.   No wheezes, crackles, or rhonchi. No acute distress. Heart:  Regular rate and rhythm; no murmurs, clicks, rubs, or gallops. Abdomen:  Normal bowel sounds. Soft, non-tender and non-distended without masses, hepatosplenomegaly or hernias noted.  No guarding or rebound tenderness.   Rectal: Not performed Msk:  Symmetrical without gross deformities. Good, equal movement & strength bilaterally. Pulses:  Normal pulses noted. Extremities:  No clubbing or edema.  No cyanosis. Neurologic:  Alert and oriented x3;  grossly normal neurologically. Skin:  Intact without significant lesions or rashes. No jaundice. Psych:  Alert and cooperative. Normal mood and affect.  Imaging Studies: Reviewed  Assessment and Plan:   ASHANTA AMOROSO is a 39 y.o. pleasant Caucasian female with no significant past medical history is seen in consultation for chronic left lower quadrant pain and constipation.  Labs and imaging studies are unremarkable  Chronic left lower quadrant pain most likely secondary to severe constipation: Currently resolved Flexible sigmoidoscopy is unremarkable Continue high-fiber diet Adequate intake of water continue fiber supplements as she is doing Also, reassured patient that having a low cholesterol has nothing to do with any underlying pathology.  Her BMI is normal.  Suggested her to talk to her life insurance company   Follow up as needed   Cephas Darby, MD

## 2020-07-08 DIAGNOSIS — H1032 Unspecified acute conjunctivitis, left eye: Secondary | ICD-10-CM | POA: Diagnosis not present

## 2020-08-01 ENCOUNTER — Other Ambulatory Visit: Payer: Self-pay | Admitting: Family Medicine

## 2020-08-02 NOTE — Telephone Encounter (Signed)
Pt had an acute appt on 02/18/20, but no recent f/u or CPE and no future appts., please advise

## 2020-08-29 ENCOUNTER — Other Ambulatory Visit: Payer: Self-pay | Admitting: Family Medicine

## 2020-09-07 ENCOUNTER — Other Ambulatory Visit: Payer: Self-pay | Admitting: Family Medicine

## 2020-09-08 DIAGNOSIS — Z1329 Encounter for screening for other suspected endocrine disorder: Secondary | ICD-10-CM | POA: Diagnosis not present

## 2020-09-08 DIAGNOSIS — Z0000 Encounter for general adult medical examination without abnormal findings: Secondary | ICD-10-CM | POA: Diagnosis not present

## 2020-09-08 DIAGNOSIS — Z01419 Encounter for gynecological examination (general) (routine) without abnormal findings: Secondary | ICD-10-CM | POA: Diagnosis not present

## 2020-09-08 DIAGNOSIS — Z131 Encounter for screening for diabetes mellitus: Secondary | ICD-10-CM | POA: Diagnosis not present

## 2020-09-08 DIAGNOSIS — Z6821 Body mass index (BMI) 21.0-21.9, adult: Secondary | ICD-10-CM | POA: Diagnosis not present

## 2020-09-08 DIAGNOSIS — Z13220 Encounter for screening for lipoid disorders: Secondary | ICD-10-CM | POA: Diagnosis not present

## 2020-09-08 DIAGNOSIS — Z1231 Encounter for screening mammogram for malignant neoplasm of breast: Secondary | ICD-10-CM | POA: Diagnosis not present

## 2020-09-08 DIAGNOSIS — Z130 Encounter for screening for diseases of the blood and blood-forming organs and certain disorders involving the immune mechanism: Secondary | ICD-10-CM | POA: Diagnosis not present

## 2020-09-08 NOTE — Telephone Encounter (Signed)
Pt had an acute appt on 02/18/20 but no recent f/u or CPE and no future appts., please advise

## 2020-10-11 ENCOUNTER — Other Ambulatory Visit: Payer: Self-pay | Admitting: Family Medicine

## 2020-10-11 NOTE — Telephone Encounter (Signed)
Name of Medication:Xanax Name of Pharmacy:[facility] or Written Date and Quantity:05/27/20#60 tabs with 1 refills Last Office Visit and Type:acute abd pain appt on8/11/21 Next Office Visit and Type:none scheduled Last Controlled Substance Agreement Date:12/10/12 Last UDS:12/10/12

## 2020-10-18 DIAGNOSIS — H16142 Punctate keratitis, left eye: Secondary | ICD-10-CM | POA: Diagnosis not present

## 2021-01-26 DIAGNOSIS — H04123 Dry eye syndrome of bilateral lacrimal glands: Secondary | ICD-10-CM | POA: Diagnosis not present

## 2021-01-27 ENCOUNTER — Other Ambulatory Visit: Payer: Self-pay | Admitting: Family Medicine

## 2021-01-27 NOTE — Telephone Encounter (Signed)
Last filled on 08/02/20 # 90 with 1 refill.  Patient does not have future appointments scheduled. LOV was on 02/18/20 for acute visit

## 2021-01-27 NOTE — Telephone Encounter (Signed)
Please schedule f/u and refill until then  

## 2021-01-28 NOTE — Telephone Encounter (Signed)
Med refilled once and will route to support pool to scheduled f/u

## 2021-01-31 NOTE — Telephone Encounter (Signed)
Left voice message to call the office  

## 2021-02-04 ENCOUNTER — Ambulatory Visit (INDEPENDENT_AMBULATORY_CARE_PROVIDER_SITE_OTHER): Payer: BC Managed Care – PPO | Admitting: Family Medicine

## 2021-02-04 ENCOUNTER — Encounter: Payer: Self-pay | Admitting: Family Medicine

## 2021-02-04 ENCOUNTER — Other Ambulatory Visit: Payer: Self-pay

## 2021-02-04 VITALS — BP 94/78 | HR 91 | Temp 97.5°F | Ht 67.0 in | Wt 135.0 lb

## 2021-02-04 DIAGNOSIS — J301 Allergic rhinitis due to pollen: Secondary | ICD-10-CM | POA: Diagnosis not present

## 2021-02-04 DIAGNOSIS — F419 Anxiety disorder, unspecified: Secondary | ICD-10-CM

## 2021-02-04 DIAGNOSIS — N3944 Nocturnal enuresis: Secondary | ICD-10-CM | POA: Diagnosis not present

## 2021-02-04 DIAGNOSIS — Z0000 Encounter for general adult medical examination without abnormal findings: Secondary | ICD-10-CM

## 2021-02-04 DIAGNOSIS — H04129 Dry eye syndrome of unspecified lacrimal gland: Secondary | ICD-10-CM | POA: Diagnosis not present

## 2021-02-04 MED ORDER — FLUTICASONE PROPIONATE 50 MCG/ACT NA SUSP
NASAL | 3 refills | Status: DC
Start: 2021-02-04 — End: 2022-02-28

## 2021-02-04 MED ORDER — ALPRAZOLAM 0.25 MG PO TABS
ORAL_TABLET | ORAL | 1 refills | Status: DC
Start: 2021-02-04 — End: 2021-08-18

## 2021-02-04 NOTE — Assessment & Plan Note (Signed)
vesicare continues to work very well for her  She avoids caffeine and bladder irritants as well

## 2021-02-04 NOTE — Assessment & Plan Note (Signed)
Unfortunately worsened by allegra and vesicare (she benefits from both however)   Using gel eye drops Has seen oph Cannot afford restasis unfortunately

## 2021-02-04 NOTE — Patient Instructions (Addendum)
Check out apps for phone Calm (I like the best)  Headspace   Goal to reduce need for anxiety medicines   No change in medication   Keep using the gel eye drops for dry eye

## 2021-02-04 NOTE — Assessment & Plan Note (Signed)
Continues daily allegra in season and flonase Works well for her

## 2021-02-04 NOTE — Assessment & Plan Note (Signed)
Pretty much only affected during the school year  Summer (not working) is fine Disc other opt for anxiety/sleep like mindfulness training Handouts given and enc to try Calm or Headspace app  Goal would be to need less xanax  Disc risks of that medication incl memory problems and habit

## 2021-02-04 NOTE — Progress Notes (Signed)
Subjective:    Patient ID: Stephanie [name], female    DOB: 09/29/1980, 40 y.o.   MRN: [id number]  This visit occurred during the SARS-CoV-2 public health emergency.  Safety protocols were in place, including screening questions prior to the visit, additional usage of staff PPE, and extensive cleaning of exam room while observing appropriate contact time as indicated for disinfecting solutions.   HPI Pt presents for f/u of chronic health problems including allergies and enuresis and anxiety   Wt Readings from Last 3 Encounters:  02/04/21 135 lb (61.2 kg)  06/10/20 134 lb 6 oz (61 kg)  04/15/20 128 lb (58.1 kg)   21.14 kg/m  Off for the summer  Traveling/ beach   Has been feeling good overall   All rhinitis  Takes allegra 180 mg -stays on it all the time  Flonase ns daily   Has had pain in L eye since December  Went to oph Steroid eye drops  Dx with chronic dry eye-px restasis (cannot afford it)  Using otc gel eye drops for dry eye   Enuresis  Takes vesicare 10 mg daily  Doing very well/works well    Anxiety  Prn alprazolam-needs to use it during school/to sleep  Last refilled in April So much better over the summer since less stress  [place] when school starts    Patient Active Problem List   Diagnosis Date Noted   Dry eye 02/04/2021   Chronic LLQ pain 02/18/2020   Fatigue 11/11/2013   PLANTAR FASCIITIS 09/13/2009   Nocturnal enuresis 11/25/2008   Anxiety disorder 07/01/2007   Allergic rhinitis 03/08/2007   Past Medical History:  Diagnosis Date   Allergy    Anxiety    Nonorganic enuresis    nocturnal/lifetime   Past Surgical History:  Procedure Laterality Date   FLEXIBLE SIGMOIDOSCOPY N/A 04/15/2020   Procedure: FLEXIBLE SIGMOIDOSCOPY;  Surgeon: Lin Landsman, MD;  Location: [facility] ENDOSCOPY;  Service: Gastroenterology;  Laterality: N/A;   TONSILLECTOMY     Social History   Tobacco Use   Smoking status: Never   Smokeless tobacco: Never   Substance Use Topics   Alcohol use: No    Alcohol/week: 0.0 standard drinks   Drug use: No   Family History  Problem Relation Age of Onset   Cancer Father        Pancreatic   Cancer Paternal Grandmother        Breast CA   Allergies  Allergen Reactions   Erythromycin     REACTION: GI UPSET   Penicillins     REACTION: HIVES   Sulfa Antibiotics     rash   Current Outpatient Medications on File Prior to Visit  Medication Sig Dispense Refill   fexofenadine (ALLEGRA) 180 MG tablet Take 180 mg by mouth daily as needed.       Methylcellulose, Laxative, (CITRUCEL PO) Take by mouth.     PREBIOTIC PRODUCT PO Take 1 tablet by mouth daily.     Probiotic Product (PROBIOTIC DAILY PO) Take 1 tablet by mouth daily.     solifenacin (VESICARE) 10 MG tablet TAKE 1 TABLET BY MOUTH EVERY DAY 30 tablet 0   TRI-ESTARYLLA 0.18/0.215/0.25 MG-35 MCG tablet Take 1 tablet by mouth daily.     No current facility-administered medications on file prior to visit.    Review of Systems  Constitutional:  Negative for activity change, appetite change, fatigue, fever and unexpected weight change.  HENT:  Negative for congestion, ear pain, postnasal drip,  rhinorrhea, sinus pressure and sore throat.        Rhinitis is fairly well controlled  Eyes:  Positive for pain. Negative for redness and visual disturbance.       From dry eye  Respiratory:  Negative for cough, shortness of breath and wheezing.   Cardiovascular:  Negative for chest pain and palpitations.  Gastrointestinal:  Negative for abdominal pain, blood in stool, constipation and diarrhea.  Endocrine: Negative for polydipsia and polyuria.  Genitourinary:  Positive for frequency. Negative for dysuria and urgency.  Musculoskeletal:  Negative for arthralgias, back pain and myalgias.  Skin:  Negative for pallor and rash.  Allergic/Immunologic: Negative for environmental allergies.  Neurological:  Negative for dizziness, syncope and headaches.   Hematological:  Negative for adenopathy. Does not bruise/bleed easily.  Psychiatric/Behavioral:  Negative for decreased concentration and dysphoric mood. The patient is not nervous/anxious.        Anxiety is better during the summer      Objective:   Physical Exam Constitutional:      General: She is not in acute distress.    Appearance: Normal appearance. She is well-developed and normal weight. She is not ill-appearing or diaphoretic.  HENT:     Head: Normocephalic and atraumatic.     Nose: No congestion.     Mouth/Throat:     Mouth: Mucous membranes are moist.     Pharynx: Oropharynx is clear.  Eyes:     General: No scleral icterus.       Right eye: No discharge.        Left eye: No discharge.     Extraocular Movements: Extraocular movements intact.     Conjunctiva/sclera: Conjunctivae normal.     Pupils: Pupils are equal, round, and reactive to light.  Neck:     Thyroid: No thyromegaly.     Vascular: No carotid bruit or JVD.  Cardiovascular:     Rate and Rhythm: Normal rate and regular rhythm.     Heart sounds: Normal heart sounds.    No gallop.  Pulmonary:     Effort: Pulmonary effort is normal. No respiratory distress.     Breath sounds: Normal breath sounds. No wheezing or rales.  Abdominal:     General: Bowel sounds are normal. There is no distension or abdominal bruit.     Palpations: Abdomen is soft. There is no mass.     Tenderness: There is no abdominal tenderness. There is no right CVA tenderness or left CVA tenderness.     Comments: No suprapubic tenderness or fullness    Musculoskeletal:     Cervical back: Normal range of motion and neck supple.     Right lower leg: No edema.     Left lower leg: No edema.  Lymphadenopathy:     Cervical: No cervical adenopathy.  Skin:    General: Skin is warm and dry.     Coloration: Skin is not pale.     Findings: No rash.     Comments: tanned  Neurological:     Mental Status: She is alert.     Sensory: No sensory  deficit.     Coordination: Coordination normal.     Deep Tendon Reflexes: Reflexes are normal and symmetric. Reflexes normal.  Psychiatric:        Mood and Affect: Mood normal.          Assessment & Plan:   Problem List Items Addressed This Visit       Respiratory   Allergic  rhinitis    Continues daily allegra in season and flonase Works well for her         Other   Anxiety disorder - Primary    Pretty much only affected during the school year  Summer (not working) is fine Disc other opt for anxiety/sleep like mindfulness training Handouts given and enc to try Calm or Headspace app  Goal would be to need less xanax  Disc risks of that medication incl memory problems and habit        Relevant Medications   ALPRAZolam (XANAX) 0.25 MG tablet   Nocturnal enuresis    vesicare continues to work very well for her  She avoids caffeine and bladder irritants as well       Dry eye    Unfortunately worsened by allegra and vesicare (she benefits from both however)   Using gel eye drops Has seen oph Cannot afford restasis unfortunately

## 2021-02-25 ENCOUNTER — Other Ambulatory Visit: Payer: Self-pay | Admitting: Family Medicine

## 2021-03-04 ENCOUNTER — Telehealth: Payer: Self-pay

## 2021-03-04 NOTE — Telephone Encounter (Signed)
Patient called stating she received a bill from her visit on 02/04/21 of the amount of $260, her insurance did not cover her visit. SHe thought this was her CPE which would have been covered 100%. Went over the notes and the reason for the visit that it was scheduled as medication refills and patient did not know that it did not mean a physical unless she specified physical. Is there anything we can do to help with this bill amount?  Also, patient wanted to know if this would be a separate visit other than CPE for her should she just see if her gynecologist can take over her medications when she has her CPEs there? She can not afford getting a bill like this every time.  I apologized for the misunderstanding and the issues this has caused and that I would send this message to Dr Glori Bickers and Donzetta Matters to see if anything can be done and get answer to the other question.

## 2021-03-04 NOTE — Telephone Encounter (Signed)
It should have been billed as a problem visit, not a physical.  I don't think I used problem code of physical.  If I accidentally billed as a physical it is my fault and can it be changed?   If not I will pay her bill.

## 2021-03-07 IMAGING — CT CT ABD-PELV W/ CM
2 of 5 series · 16 of 46 positions shown, 18 images · IV contrast (omnipaque)
Comparison: None.

CLINICAL DATA: Worsening left lower quadrant pain

EXAM:
CT ABDOMEN AND PELVIS WITH CONTRAST
TECHNIQUE: Multidetector CT imaging of the abdomen and pelvis was performed
using the standard protocol following bolus administration of
intravenous contrast.
CONTRAST:  80mL OMNIPAQUE IOHEXOL 300 MG/ML  SOLN

[Series 2: abd pelvis 5.00 · axial · 0.67mm/px · z∈[-1543,-1128]mm · 13 of 93 slices shown, 15 images]
[im 5/93  soft-tissue]
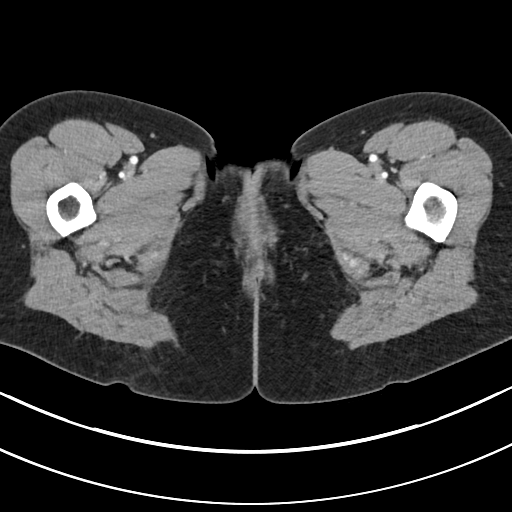
[im 5/93  bone]
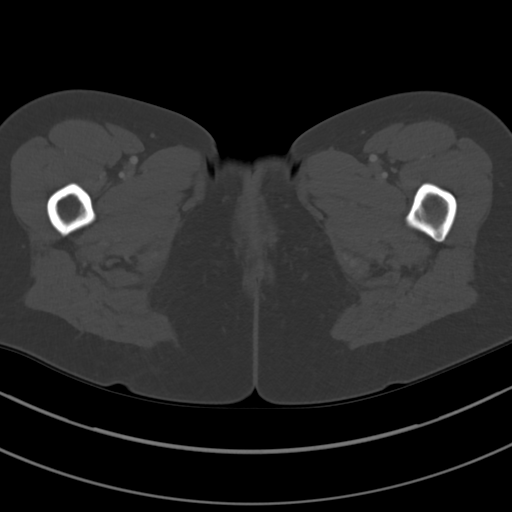
[im 15/93  soft-tissue]
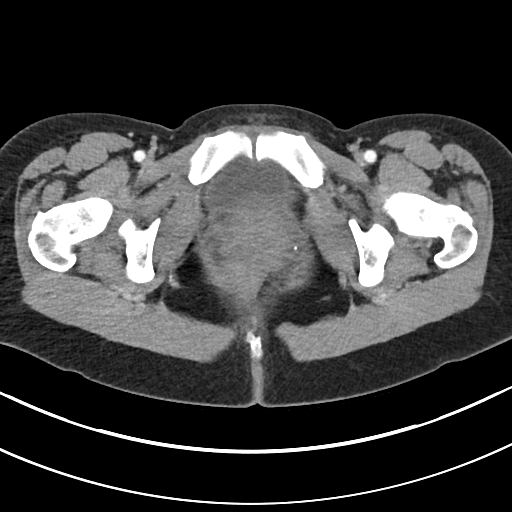
[im 20/93  soft-tissue]
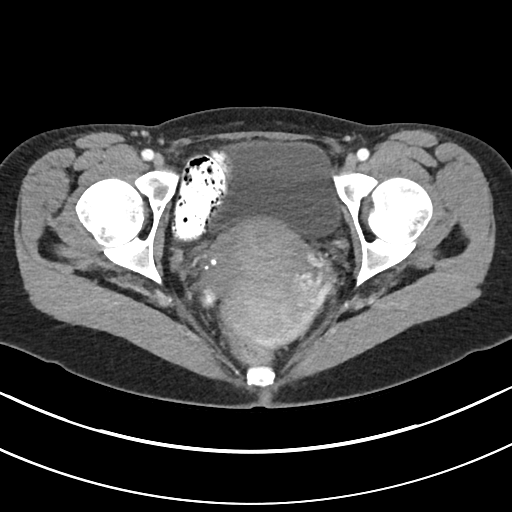
[im 25/93  soft-tissue]
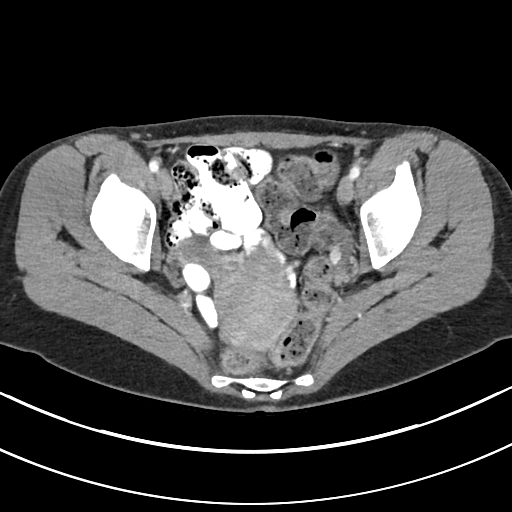
[im 34/93  soft-tissue]
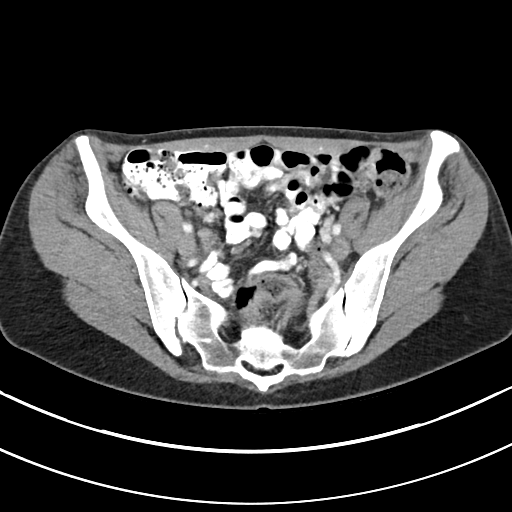
[im 39/93  soft-tissue]
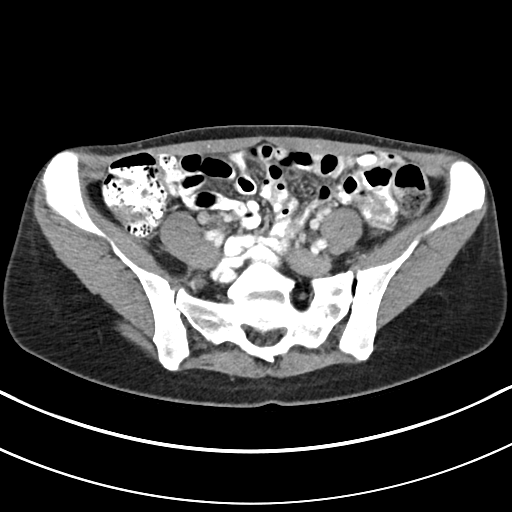
[im 49/93  soft-tissue]
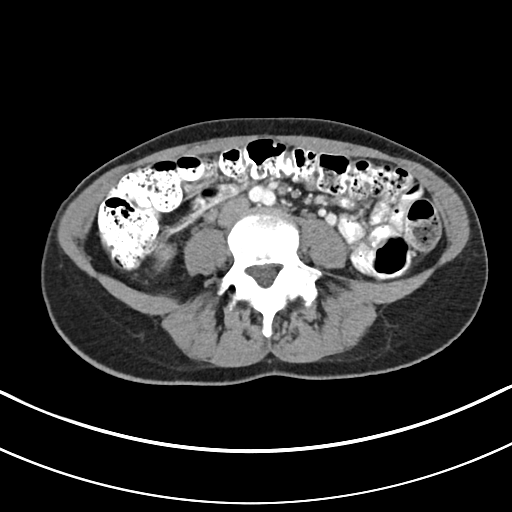
[im 54/93  soft-tissue]
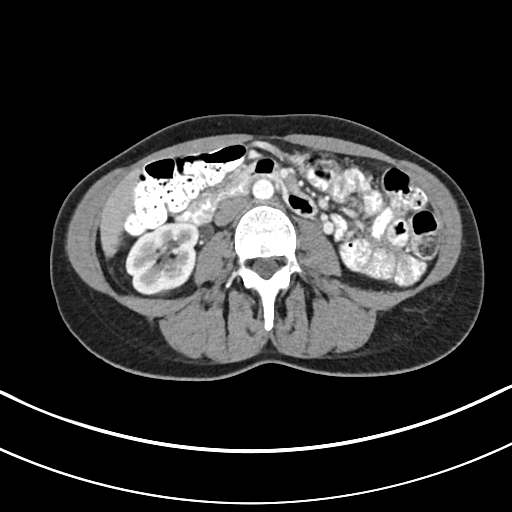
[im 59/93  soft-tissue]
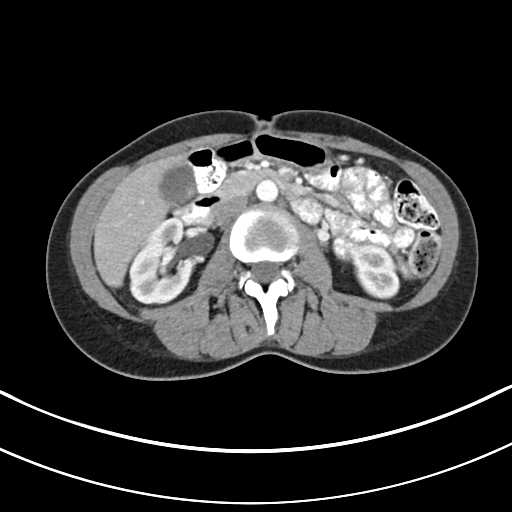
[im 59/93  bone]
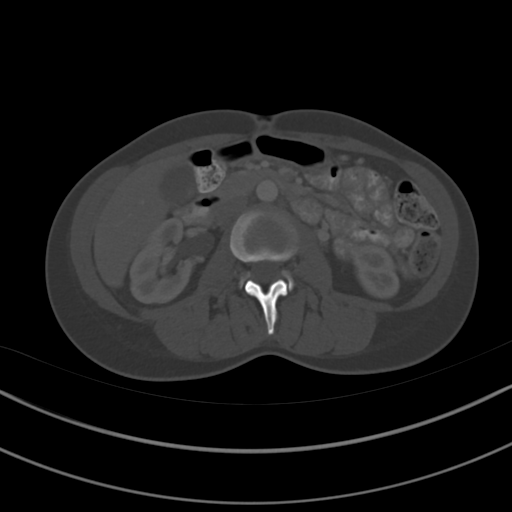
[im 68/93  soft-tissue]
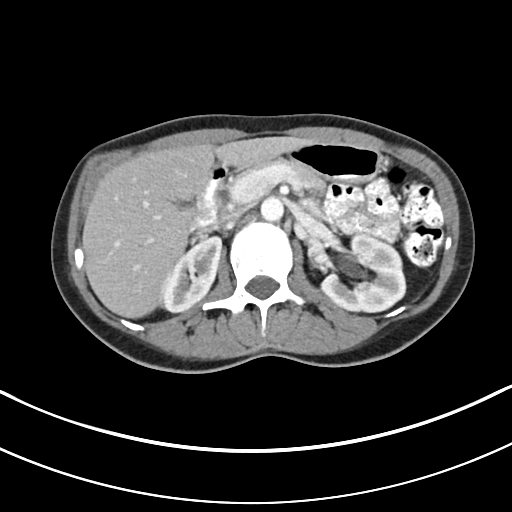
[im 73/93  soft-tissue]
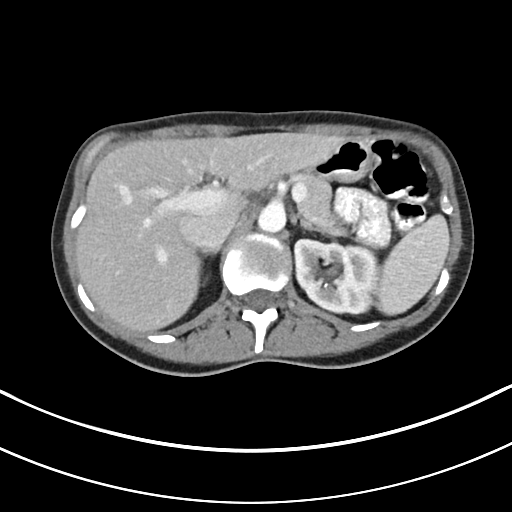
[im 78/93  soft-tissue]
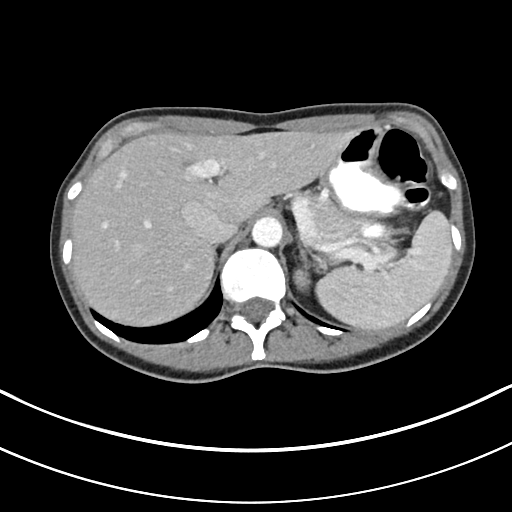
[im 88/93  soft-tissue]
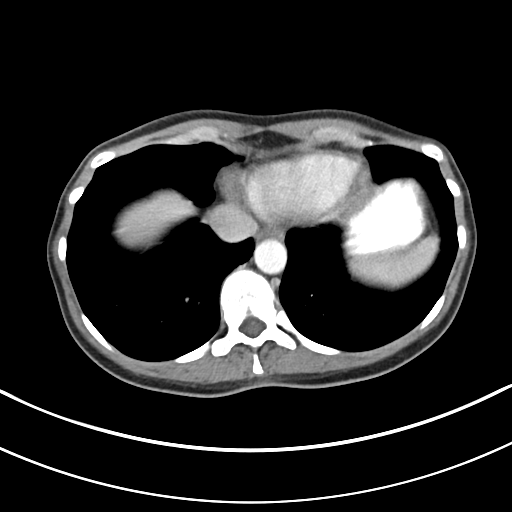

[Series 4: coronals abd pelvis 2.00 cor · coronal · 0.67mm/px · 3 of 108 slices shown]
[im 36/108  soft-tissue]
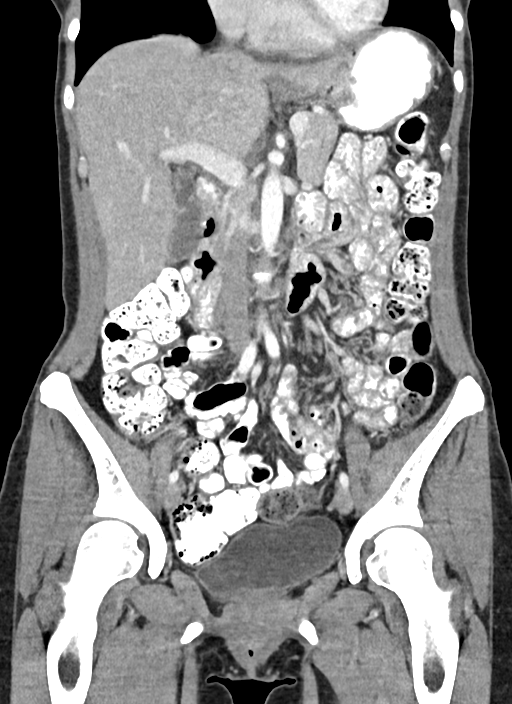
[im 48/108  soft-tissue]
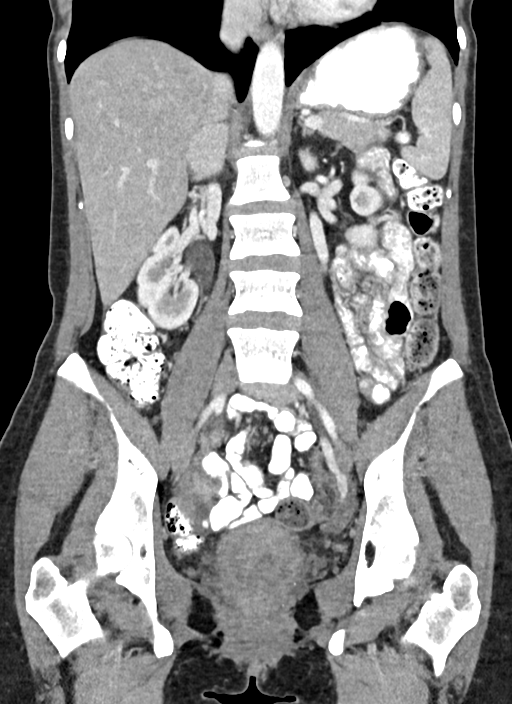
[im 60/108  soft-tissue]
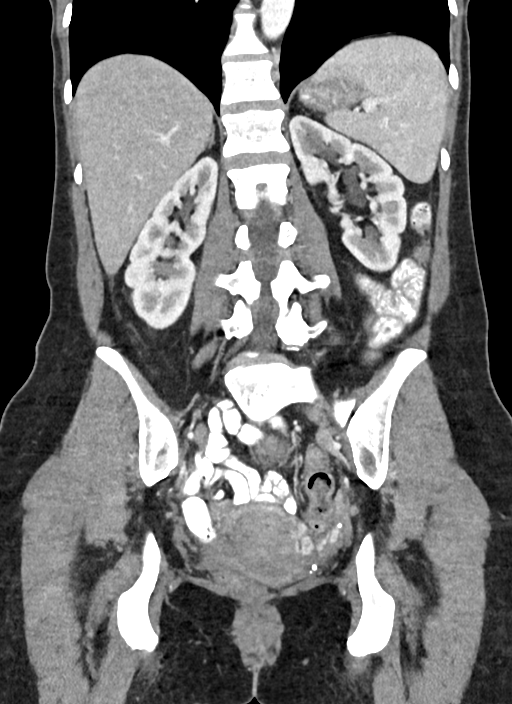

[16 of 46 positions shown; findings below may reference images not displayed]

FINDINGS: Lower chest: No acute abnormality.

Hepatobiliary: No focal liver abnormality is seen. No gallstones,
gallbladder wall thickening, or biliary dilatation.

Pancreas: Unremarkable

Spleen: Unremarkable.

Adrenals/Urinary Tract: There are a few punctate 1-2 mm calculi of
the left kidney. No hydronephrosis. Bladder is unremarkable.
Adrenals are unremarkable.

Stomach/Bowel: Stomach is within normal limits. Bowel is normal in
caliber. Moderate stool burden. Appendix is not definitely
visualized.

Vascular/Lymphatic: No significant vascular findings. There are no
enlarged lymph nodes identified.

Reproductive: Uterus and bilateral adnexa are unremarkable.

Other: Abdominal wall is unremarkable. Trace free fluid in the
pelvis is probably physiologic.

Musculoskeletal: No acute osseous abnormality.
IMPRESSION: Few tiny nonobstructing left renal calculi.

## 2021-03-07 NOTE — Telephone Encounter (Signed)
Dr Glori Bickers, I just wanted to make sure there was not a confusion in my long message. Patient said that she thought it would be a physical and then her insurance would have covered it but because it was billed as a problem visit-medication management she is having to pay the whole bill.

## 2021-03-08 NOTE — Telephone Encounter (Signed)
Called patient to discuss. She states she was under the impression that this would be considered her annual visit and that it would be covered by insurance. I apologized for the miscommunication and advised her that it was scheduled as a medication follow up (as that is what was advised in 7/21 refill encounter) and is not considered the same as an annual physical. I explained the difference between an annual physical and an office visit. I did advise her in the future to specify that she would like to be scheduled for her annual physical if we contact her to schedule a medication refill. She asked if there was anything that we can do. Please advise. I let her know that I would look into this and reach back out to her in order to let her know. She thanked me for giving her a call back and looking into this.

## 2021-03-17 ENCOUNTER — Ambulatory Visit: Payer: Self-pay | Admitting: Dermatology

## 2021-08-17 ENCOUNTER — Other Ambulatory Visit: Payer: Self-pay | Admitting: Family Medicine

## 2021-08-18 NOTE — Telephone Encounter (Signed)
Name of Medication: Xanax Name of Pharmacy: [facility] [address] Last Venida Jarvis or Written Date and Quantity: 02/04/21 #60 tabs/ 1 refill Last Office Visit and Type: Med reifll on 02/04/21 Next Office Visit and Type: non scheduled

## 2021-09-01 DIAGNOSIS — Z800 Family history of malignant neoplasm of digestive organs: Secondary | ICD-10-CM | POA: Diagnosis not present

## 2021-09-01 DIAGNOSIS — Z01419 Encounter for gynecological examination (general) (routine) without abnormal findings: Secondary | ICD-10-CM | POA: Diagnosis not present

## 2021-09-01 DIAGNOSIS — Z1231 Encounter for screening mammogram for malignant neoplasm of breast: Secondary | ICD-10-CM | POA: Diagnosis not present

## 2021-09-01 DIAGNOSIS — Z6821 Body mass index (BMI) 21.0-21.9, adult: Secondary | ICD-10-CM | POA: Diagnosis not present

## 2021-09-21 DIAGNOSIS — Z3202 Encounter for pregnancy test, result negative: Secondary | ICD-10-CM | POA: Diagnosis not present

## 2021-09-21 DIAGNOSIS — N840 Polyp of corpus uteri: Secondary | ICD-10-CM | POA: Diagnosis not present

## 2021-09-21 DIAGNOSIS — N926 Irregular menstruation, unspecified: Secondary | ICD-10-CM | POA: Diagnosis not present

## 2021-10-27 DIAGNOSIS — D250 Submucous leiomyoma of uterus: Secondary | ICD-10-CM | POA: Diagnosis not present

## 2021-10-27 DIAGNOSIS — D251 Intramural leiomyoma of uterus: Secondary | ICD-10-CM | POA: Diagnosis not present

## 2021-10-27 DIAGNOSIS — N840 Polyp of corpus uteri: Secondary | ICD-10-CM | POA: Diagnosis not present

## 2021-10-27 HISTORY — PX: UTERINE FIBROID SURGERY: SHX826

## 2021-12-12 ENCOUNTER — Other Ambulatory Visit: Payer: Self-pay | Admitting: Family Medicine

## 2021-12-13 NOTE — Telephone Encounter (Signed)
Is this okay to refill  Last Filled 10/17/21 Last OV 02/04/21 Next  OV 12/16/21

## 2021-12-16 ENCOUNTER — Encounter: Payer: Self-pay | Admitting: Family Medicine

## 2021-12-16 ENCOUNTER — Ambulatory Visit (INDEPENDENT_AMBULATORY_CARE_PROVIDER_SITE_OTHER): Payer: BLUE CROSS/BLUE SHIELD | Admitting: Family Medicine

## 2021-12-16 VITALS — BP 96/66 | HR 79 | Ht 67.0 in | Wt 136.4 lb

## 2021-12-16 DIAGNOSIS — D259 Leiomyoma of uterus, unspecified: Secondary | ICD-10-CM

## 2021-12-16 DIAGNOSIS — N3944 Nocturnal enuresis: Secondary | ICD-10-CM

## 2021-12-16 DIAGNOSIS — H04129 Dry eye syndrome of unspecified lacrimal gland: Secondary | ICD-10-CM | POA: Diagnosis not present

## 2021-12-16 DIAGNOSIS — F419 Anxiety disorder, unspecified: Secondary | ICD-10-CM | POA: Diagnosis not present

## 2021-12-16 NOTE — Progress Notes (Unsigned)
   Subjective:    Patient ID: Stephanie [name], female    DOB: 09-Jun-1981, 41 y.o.   MRN: [id number]  HPI Pt presents for f/u of chronic health problems including anxiety and eneuresis   Wt Readings from Last 3 Encounters:  12/16/21 136 lb 6.4 oz (61.9 kg)  02/04/21 135 lb (61.2 kg)  06/10/20 134 lb 6 oz (61 kg)   21.36 kg/m  Doing well  Had an ok school year   Had surgery for fibroids and polyps (horrible heavy bleeding) hard to deal with  Still some irregular bleeding  Lap in April  Now on junel 1/20  May have an ablation  Gyn Dr Valentino Saxon   The health problems made her anxiety worse  Heavy bleeding made her feel worried all the time  Did not sleep as well also  Hormones were going crazy for a while  Hot natured and could not go back to sleep   Xanax takes 1/2 pill to sleep at night  Was on 1 pill for a while and now does not need that much thankfully    Night time eneuresis  Takes vesicare 10 mg daily -stopped it  Her eye doctor thought it worsened her chronic dry eye  Now on restasis since dec Helping a lot  Failed detrol and urised and DDAVP in the past   She was wearing adult undergarments every night due to bleeding  One night per week/ has pm urination     Review of Systems     Objective:   Physical Exam        Assessment & Plan:

## 2021-12-16 NOTE — Patient Instructions (Addendum)
Take care of yourself   Get through the gyn issues   Then if you want to try vesicare again- do so for a week and let us know how it goes If dry eye worsens-do not continue it

## 2021-12-18 DIAGNOSIS — D259 Leiomyoma of uterus, unspecified: Secondary | ICD-10-CM | POA: Insufficient documentation

## 2021-12-18 NOTE — Assessment & Plan Note (Signed)
Stable Anxiety is better when not in school year Down to 1/2 xanax at night  Caution of sedation and habit  Reviewed stressors/ coping techniques/symptoms/ support sources/ tx options and side effects in detail today Good self care  Stressed imp of exercise

## 2021-12-18 NOTE — Assessment & Plan Note (Signed)
Off vesicare due to dry eye but may have a re trial  Happens 2 times per week Has been wearing adult undergarment for heavy menstrual bleeding anyway  Has failed detrol and DDAVP in the past  Consider appt with urology

## 2021-12-18 NOTE — Assessment & Plan Note (Signed)
ophy inst pt to hold vesicare for dry eye symptoms Now using restasis  May try it again and see if tolerable with that medication

## 2021-12-18 NOTE — Assessment & Plan Note (Signed)
Partially removed  Now bleeding is improved and she is considering ablation vs hysterectomy

## 2022-02-25 ENCOUNTER — Other Ambulatory Visit: Payer: Self-pay | Admitting: Family Medicine

## 2022-04-09 ENCOUNTER — Other Ambulatory Visit: Payer: Self-pay | Admitting: Family Medicine

## 2022-04-10 NOTE — Telephone Encounter (Signed)
Name of Medication: Xanax Name of Pharmacy: [facility] Henrietta Dine or Written Date and Quantity: 12/13/21 #60 tab/ 1 refill  Last Office Visit and Type: f/u on 12/16/21 Next Office Visit and Type: none scheduled

## 2022-06-06 ENCOUNTER — Telehealth (INDEPENDENT_AMBULATORY_CARE_PROVIDER_SITE_OTHER): Payer: BC Managed Care – PPO | Admitting: Family

## 2022-06-06 ENCOUNTER — Encounter: Payer: Self-pay | Admitting: Family

## 2022-06-06 VITALS — Ht 66.5 in | Wt 129.0 lb

## 2022-06-06 DIAGNOSIS — J0110 Acute frontal sinusitis, unspecified: Secondary | ICD-10-CM | POA: Insufficient documentation

## 2022-06-06 MED ORDER — DOXYCYCLINE HYCLATE 100 MG PO TABS: 100.0000 mg | ORAL_TABLET | Freq: Two times a day (BID) | ORAL | 0 refills | Status: AC

## 2022-06-06 NOTE — Assessment & Plan Note (Signed)
Take antibiotic as prescribed. Increase oral fluids. Pt to f/u if sx worsen and or fail to improve in 2-3 days.  Recommend stopping claritin and starting zyrtec nightly.

## 2022-06-06 NOTE — Progress Notes (Signed)
MyChart Video Visit    Virtual Visit via Video Note   This visit type was conducted due to national recommendations for restrictions regarding the COVID-19 Pandemic (e.g. social distancing) in an effort to limit this patient's exposure and mitigate transmission in our community. This patient is at least at moderate risk for complications without adequate follow up. This format is felt to be most appropriate for this patient at this time. Physical exam was limited by quality of the video and audio technology used for the visit. CMA was able to get the patient set up on a video visit.  Patient location: Home. Patient and provider in visit Provider location: Office  I discussed the limitations of evaluation and management by telemedicine and the availability of in person appointments. The patient expressed understanding and agreed to proceed.  Visit Date: 06/06/2022  Today's healthcare provider: Eugenia Pancoast, FNP     Subjective:    Patient ID: Stephanie [name], female    DOB: May 01, 1981, 41 y.o.   MRN: [id number]  Chief Complaint  Patient presents with   Cough    X 2 weeks  in her chest and throat.    Cough    Pt here today via video visit with concerns.   She has had flu like symptoms over the last two weeks. Last week more in her head , had a fever and constant headache with sinus pressure and nasal congestion. She didn't go to thanksgiving because she wasn't feeling well. This week feels more settled in her chest, and she is still with sinus pressure/frontal and maxillary, nasal congestion, and dry sore throat and loss of voice. No fever this week. She does have a productive cough.   She did not get tested for covid.  Taking mucinex otc with mild relief.   She is taking claritin daily.  Past Medical History:  Diagnosis Date   Allergy    Anxiety    Nonorganic enuresis    nocturnal/lifetime    Past Surgical History:  Procedure Laterality Date   FLEXIBLE  SIGMOIDOSCOPY N/A 04/15/2020   Procedure: FLEXIBLE SIGMOIDOSCOPY;  Surgeon: Lin Landsman, MD;  Location: [facility] ENDOSCOPY;  Service: Gastroenterology;  Laterality: N/A;   TONSILLECTOMY     UTERINE FIBROID SURGERY  10/27/2021    Family History  Problem Relation Age of Onset   Cancer Father        Pancreatic   Cancer Paternal Grandmother        Breast CA    Social History   Socioeconomic History   Marital status: Married    Spouse name: Not on file   Number of children: Not on file   Years of education: Not on file   Highest education level: Not on file  Occupational History   Occupation: [employer]  Tobacco Use   Smoking status: Never   Smokeless tobacco: Never  Vaping Use   Vaping Use: Never used  Substance and Sexual Activity   Alcohol use: No    Alcohol/week: 0.0 standard drinks of alcohol   Drug use: No   Sexual activity: Not on file  Other Topics Concern   Not on file  Social History Narrative   Not on file   Social Determinants of Health   Financial Resource Strain: Not on file  Food Insecurity: Not on file  Transportation Needs: Not on file  Physical Activity: Not on file  Stress: Not on file  Social Connections: Not on file  Intimate Partner Violence: Not on  file    Outpatient Medications Prior to Visit  Medication Sig Dispense Refill   ALPRAZolam (XANAX) 0.25 MG tablet TAKE 1 TO 2 TABLETS BY MOUTH TWICE A DAY AS NEEDED 60 tablet 1   fexofenadine (ALLEGRA) 180 MG tablet Take 180 mg by mouth daily as needed.       fluticasone (FLONASE) 50 MCG/ACT nasal spray SPRAY 2 SPRAYS INTO EACH NOSTRIL EVERY DAY 48 mL 3   JUNEL 1/20 1-20 MG-MCG tablet Take 1 tablet by mouth daily.     Methylcellulose, Laxative, (CITRUCEL PO) Take by mouth. (Patient not taking: Reported on 06/06/2022)     No facility-administered medications prior to visit.    Allergies  Allergen Reactions   Erythromycin     REACTION: GI UPSET   Penicillins     REACTION: HIVES   Sulfa  Antibiotics     rash    Review of Systems  Respiratory:  Positive for cough.        Objective:    Physical Exam Constitutional:      General: She is not in acute distress.    Appearance: Normal appearance. She is not ill-appearing or toxic-appearing.  Pulmonary:     Effort: Pulmonary effort is normal.  Neurological:     General: No focal deficit present.     Mental Status: She is alert and oriented to person, place, and time. Mental status is at baseline.  Psychiatric:        Mood and Affect: Mood normal.        Behavior: Behavior normal.        Thought Content: Thought content normal.        Judgment: Judgment normal.     Ht 5' 6.5" (1.689 m)   Wt 129 lb (58.5 kg)   BMI 20.51 kg/m  Wt Readings from Last 3 Encounters:  06/06/22 129 lb (58.5 kg)  12/16/21 136 lb 6.4 oz (61.9 kg)  02/04/21 135 lb (61.2 kg)       Assessment & Plan:   Problem List Items Addressed This Visit       Respiratory   Acute non-recurrent frontal sinusitis - Primary    Take antibiotic as prescribed. Increase oral fluids. Pt to f/u if sx worsen and or fail to improve in 2-3 days.  Recommend stopping claritin and starting zyrtec nightly.       Relevant Medications   doxycycline (VIBRA-TABS) 100 MG tablet    I have discontinued Stephanie [name]'s (Methylcellulose, Laxative, (CITRUCEL PO)). I am also having her start on doxycycline. Additionally, I am having her maintain her fexofenadine, Junel 1/20, fluticasone, and ALPRAZolam.  Meds ordered this encounter  Medications   doxycycline (VIBRA-TABS) 100 MG tablet    Sig: Take 1 tablet (100 mg total) by mouth 2 (two) times daily for 10 days.    Dispense:  20 tablet    Refill:  0    Order Specific Question:   Supervising Provider    Answer:   BEDSOLE, AMY E [[id number]]    I discussed the assessment and treatment plan with the patient. The patient was provided an opportunity to ask questions and all were answered. The patient agreed with the  plan and demonstrated an understanding of the instructions.   The patient was advised to call back or seek an in-person evaluation if the symptoms worsen or if the condition fails to improve as anticipated.  I provided 15 minutes of face-to-face time during this encounter.   Eugenia Pancoast, FNP [facility] [phone] (phone) [phone] (fax)  [place]

## 2022-07-25 ENCOUNTER — Other Ambulatory Visit: Payer: Self-pay | Admitting: Family Medicine

## 2022-07-26 NOTE — Telephone Encounter (Signed)
Refill request Alprazolam Last refill 04/10/22 #60/1 Last office visit 06/06/22 acute

## 2022-11-19 ENCOUNTER — Other Ambulatory Visit: Payer: Self-pay | Admitting: Family Medicine

## 2022-11-20 NOTE — Telephone Encounter (Signed)
Name of Medication: Xanax Name of Pharmacy: [facility] Lavonia Dana or Written Date and Quantity: 07/26/22 #60 tab/ 1 refill Last Office Visit and Type: f/u on 12/16/21 Next Office Visit and Type: none scheduled

## 2022-12-08 DIAGNOSIS — Z01419 Encounter for gynecological examination (general) (routine) without abnormal findings: Secondary | ICD-10-CM | POA: Diagnosis not present

## 2022-12-08 DIAGNOSIS — Z124 Encounter for screening for malignant neoplasm of cervix: Secondary | ICD-10-CM | POA: Diagnosis not present

## 2022-12-08 DIAGNOSIS — Z1231 Encounter for screening mammogram for malignant neoplasm of breast: Secondary | ICD-10-CM | POA: Diagnosis not present

## 2022-12-21 LAB — HM PAP SMEAR

## 2023-03-01 ENCOUNTER — Other Ambulatory Visit: Payer: Self-pay | Admitting: Family Medicine

## 2023-03-02 NOTE — Telephone Encounter (Signed)
Name of Medication: Xanax Name of Pharmacy: [facility] Lavonia Dana or Written Date and Quantity: 11/20/22 #60 tab/ 1 refill Last Office Visit and Type: f/u on 12/16/21 (over a year ago) Next Office Visit and Type: none scheduled

## 2023-03-02 NOTE — Telephone Encounter (Signed)
Please schedule follow up or annual exam

## 2023-03-05 NOTE — Telephone Encounter (Signed)
LVMTCB and schedule

## 2023-04-29 ENCOUNTER — Other Ambulatory Visit: Payer: Self-pay | Admitting: Family Medicine

## 2023-05-01 ENCOUNTER — Telehealth: Payer: Self-pay | Admitting: Family Medicine

## 2023-05-01 MED ORDER — FLUTICASONE PROPIONATE 50 MCG/ACT NA SUSP: NASAL | 0 refills | Status: DC

## 2023-05-01 NOTE — Telephone Encounter (Signed)
Prescription Request  05/01/2023  LOV: Visit date not found  What is the name of the medication or equipment? fluticasone (FLONASE) 50 MCG/ACT nasal spray fluticasone (FLONASE) 50 MCG/ACT nasal spray   Have you contacted your pharmacy to request a refill? Yes   Which pharmacy would you like this sent to?  [id number] [address] [place] [address] Phone: [phone] Fax: [phone]    Patient notified that their request is being sent to the clinical staff for review and that they should receive a response within 2 business days.   Please advise at Mobile [phone] (mobile)

## 2023-05-03 ENCOUNTER — Telehealth: Payer: Self-pay | Admitting: Family Medicine

## 2023-05-03 ENCOUNTER — Other Ambulatory Visit: Payer: Self-pay | Admitting: Family Medicine

## 2023-05-03 DIAGNOSIS — F419 Anxiety disorder, unspecified: Secondary | ICD-10-CM

## 2023-05-03 NOTE — Telephone Encounter (Signed)
Name of Medication:  Alprazolam Name of Pharmacy:  [facility] Last Lenox Ahr or Written Date and Quantity:  03/02/23, #60 Last Office Visit and Type:  12/16/21, anxiety f/u Next Office Visit and Type:  05/14/23, med f/u Last Controlled Substance Agreement Date:  12/10/12 Last UDS:  12/10/12, scanned

## 2023-05-03 NOTE — Telephone Encounter (Signed)
Duplicate request (see 05/03/23 refill request).

## 2023-05-03 NOTE — Telephone Encounter (Signed)
Prescription Request  05/03/2023  LOV: Visit date not found  What is the name of the medication or equipment? ALPRAZolam (XANAX) 0.25 MG tablet   Have you contacted your pharmacy to request a refill? Yes   Which pharmacy would you like this sent to?  [id number] [address] [place] [address] Phone: [phone] Fax: [phone]    Patient notified that their request is being sent to the clinical staff for review and that they should receive a response within 2 business days.   Please advise at Mobile [phone] (mobile)

## 2023-05-14 ENCOUNTER — Ambulatory Visit (INDEPENDENT_AMBULATORY_CARE_PROVIDER_SITE_OTHER): Payer: BC Managed Care – PPO | Admitting: Family Medicine

## 2023-05-14 ENCOUNTER — Encounter: Payer: Self-pay | Admitting: Family Medicine

## 2023-05-14 VITALS — BP 104/58 | HR 86 | Temp 98.3°F | Ht 66.5 in | Wt 138.0 lb

## 2023-05-14 DIAGNOSIS — J301 Allergic rhinitis due to pollen: Secondary | ICD-10-CM

## 2023-05-14 DIAGNOSIS — M546 Pain in thoracic spine: Secondary | ICD-10-CM | POA: Diagnosis not present

## 2023-05-14 DIAGNOSIS — F419 Anxiety disorder, unspecified: Secondary | ICD-10-CM | POA: Diagnosis not present

## 2023-05-14 MED ORDER — TIZANIDINE HCL 4 MG PO TABS: 4.0000 mg | ORAL_TABLET | Freq: Every evening | ORAL | 0 refills | Status: DC | PRN

## 2023-05-14 MED ORDER — FLUOXETINE HCL 10 MG PO CAPS: 10.0000 mg | ORAL_CAPSULE | Freq: Every day | ORAL | 1 refills | Status: DC

## 2023-05-14 NOTE — Assessment & Plan Note (Signed)
This has gradually worsened with time and age  Pt has noted this even if stress is the same  Takes xanax prn -would like to need this less Reviewed stressors/ coping techniques/symptoms/ support sources/ tx options and side effects in detail today Discussed opt for treatment Time limited today-will discuss counseling next time  Prescription fluoxetine 10 mg daily  Discussed expectations of SSRI medication including time to effectiveness and mechanism of action, also poss of side effects (early and late)- including mental fuzziness, weight or appetite change, nausea and poss of worse dep or anxiety (even suicidal thoughts)  Pt voiced understanding and will stop med and update if this occurs   Follow up planned 4-5 weeks Discussed importance of self care  Also exercise (she does well) Handout given

## 2023-05-14 NOTE — Assessment & Plan Note (Signed)
Seems muscular -in pt who is physically fit (dance and bar classes) Reassuring exam  Discussed use of nsaids and heat  Prescription tizanadine for bedtime prn with caution of sedation  PT referral done  Update if not starting to improve in a week or if worsening  Call back and Er precautions noted in detail today

## 2023-05-14 NOTE — Patient Instructions (Addendum)
For anxiety  Start fluoxetine 10 mg daily  If any intolerable side effects or if you feel worse-hold it and let us know   Continue xanax as needed    Use caution with tizanadine (muscle relaxer) - it can sedate I put the referral in for physical therapy  Please let us know if you don't hear in 1-2 weeks   Walking helps Stretching helps   Follow up in about 4-5 weeks

## 2023-05-14 NOTE — Progress Notes (Signed)
Subjective:    Patient ID: Stephanie [name], female    DOB: Nov 11, 1980, 42 y.o.   MRN: [id number]  HPI  Wt Readings from Last 3 Encounters:  05/14/23 138 lb (62.6 kg)  06/06/22 129 lb (58.5 kg)  12/16/21 136 lb 6.4 oz (61.9 kg)   21.94 kg/m  Vitals:   05/14/23 1500  BP: (!) 104/58  Pulse: 86  Temp: 98.3 F (36.8 C)  SpO2: 99%    Pt presents for follow up of anxiety/mood and medications and back pain  Allergic rhinitis  Anxiety   Also back pain   Back feels tight =middle part  Caused by wearing heels  No numbness or weakness   Worse after standing for a while Improved with lying on the floor  Uses heat  Both sides equal  Sharp pain if she turns wrong Otherwise aches all day  Pulling sensation  Worse to bend back than forwards   Taking ibuprofen    Uses allegra 180 mg daily prn  Flonase nasal spray  Uses it all year  Has symptoms if she misses   Declines flu shot    Does stretch yoga and dance /bar classes     GAD   Xanax 0.25 mg Takes if she really cannot sleep   Now wonders if she needs to take something all the time  Anxious all the time Even without stressors  Keeps her from sleeping  Cannot settle or relax For no reason      Working  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  Starting college tours for a junior           12/16/2021    2:36 PM 02/04/2021   12:10 PM 11/05/2017   12:33 PM  Depression screen PHQ 2/9  Decreased Interest 0 0 0  Down, Depressed, Hopeless 0 0 0  PHQ - 2 Score 0 0 0       No data to display             Patient Active Problem List   Diagnosis Date Noted   Thoracic back pain 05/14/2023   Uterine fibroid 12/18/2021   Dry eye 02/04/2021   Chronic LLQ pain 02/18/2020   Fatigue 11/11/2013   PLANTAR FASCIITIS 09/13/2009   Nocturnal enuresis 11/25/2008   Anxiety disorder 07/01/2007   Allergic rhinitis 03/08/2007   Past Medical History:  Diagnosis Date   Allergy    Anxiety    Nonorganic enuresis     nocturnal/lifetime   Past Surgical History:  Procedure Laterality Date   FLEXIBLE SIGMOIDOSCOPY N/A 04/15/2020   Procedure: FLEXIBLE SIGMOIDOSCOPY;  Surgeon: Toney Reil, MD;  Location: [facility];  Service: Gastroenterology;  Laterality: N/A;   TONSILLECTOMY     UTERINE FIBROID SURGERY  10/27/2021   Social History   Tobacco Use   Smoking status: Never   Smokeless tobacco: Never  Vaping Use   Vaping status: Never Used  Substance Use Topics   Alcohol use: No    Alcohol/week: 0.0 standard drinks of alcohol   Drug use: No   Family History  Problem Relation Age of Onset   Cancer Father        Pancreatic   Cancer Paternal Grandmother        Breast CA   Allergies  Allergen Reactions   Erythromycin     REACTION: GI UPSET   Penicillins     REACTION: HIVES   Sulfa Antibiotics     rash   Current Outpatient Medications  on File Prior to Visit  Medication Sig Dispense Refill   ALPRAZolam (XANAX) 0.25 MG tablet TAKE 1 TO 2 TABLETS BY MOUTH TWICE A DAY AS NEEDED 60 tablet 0   fexofenadine (ALLEGRA) 180 MG tablet Take 180 mg by mouth daily as needed.       fluticasone (FLONASE) 50 MCG/ACT nasal spray SPRAY 2 SPRAYS INTO EACH NOSTRIL EVERY DAY 48 mL 0   JUNEL 1/20 1-20 MG-MCG tablet Take 1 tablet by mouth daily.     No current facility-administered medications on file prior to visit.    Review of Systems  Constitutional:  Negative for activity change, appetite change, fatigue, fever and unexpected weight change.  HENT:  Negative for congestion, ear pain, rhinorrhea, sinus pressure and sore throat.   Eyes:  Negative for pain, redness and visual disturbance.  Respiratory:  Negative for cough, shortness of breath and wheezing.   Cardiovascular:  Negative for chest pain and palpitations.  Gastrointestinal:  Negative for abdominal pain, blood in stool, constipation and diarrhea.  Endocrine: Negative for polydipsia and polyuria.  Genitourinary:  Negative for dysuria,  frequency and urgency.  Musculoskeletal:  Positive for back pain. Negative for arthralgias and myalgias.  Skin:  Negative for pallor and rash.  Allergic/Immunologic: Negative for environmental allergies.  Neurological:  Negative for dizziness, syncope and headaches.  Hematological:  Negative for adenopathy. Does not bruise/bleed easily.  Psychiatric/Behavioral:  Positive for sleep disturbance. Negative for confusion, decreased concentration, dysphoric mood and suicidal ideas. The patient is nervous/anxious.        Objective:   Physical Exam Constitutional:      General: She is not in acute distress.    Appearance: Normal appearance. She is well-developed and normal weight. She is not ill-appearing or diaphoretic.  HENT:     Head: Normocephalic and atraumatic.  Eyes:     General: No scleral icterus.    Conjunctiva/sclera: Conjunctivae normal.     Pupils: Pupils are equal, round, and reactive to light.  Cardiovascular:     Rate and Rhythm: Normal rate and regular rhythm.  Pulmonary:     Effort: Pulmonary effort is normal.     Breath sounds: Normal breath sounds. No wheezing or rales.  Abdominal:     General: Bowel sounds are normal. There is no distension.     Palpations: Abdomen is soft.     Tenderness: There is no abdominal tenderness.  Musculoskeletal:        General: Tenderness present.     Cervical back: Normal range of motion and neck supple.     Thoracic back: Spasms and tenderness present. No swelling, deformity or bony tenderness. Decreased range of motion.     Lumbar back: No edema or bony tenderness. Negative right straight leg raise test and negative left straight leg raise test.     Comments: Bilateral lower thoracic muscular tightness and tenderness  No bony tenderness Pain with extension  Normal lateral bend bilateral  No neuro changes   Lymphadenopathy:     Cervical: No cervical adenopathy.  Skin:    General: Skin is warm and dry.     Coloration: Skin is not  pale.     Findings: No erythema or rash.  Neurological:     Mental Status: She is alert.     Cranial Nerves: No cranial nerve deficit.     Sensory: No sensory deficit.     Motor: No weakness, atrophy or abnormal muscle tone.     Coordination: Coordination normal.  Gait: Gait normal.     Deep Tendon Reflexes: Reflexes are normal and symmetric. Reflexes normal.     Comments: Negative SLR  Psychiatric:        Mood and Affect: Mood is anxious.        Cognition and Memory: Cognition and memory normal.     Comments: Candidly discusses symptoms and stressors             Assessment & Plan:   Problem List Items Addressed This Visit       Respiratory   Allergic rhinitis    Doing well with allegra 180 mg daily  Flonase daily  Is year round Well controlled with current medication          Other   Anxiety disorder - Primary    This has gradually worsened with time and age  Pt has noted this even if stress is the same  Takes xanax prn -would like to need this less Reviewed stressors/ coping techniques/symptoms/ support sources/ tx options and side effects in detail today Discussed opt for treatment Time limited today-will discuss counseling next time  Prescription fluoxetine 10 mg daily  Discussed expectations of SSRI medication including time to effectiveness and mechanism of action, also poss of side effects (early and late)- including mental fuzziness, weight or appetite change, nausea and poss of worse dep or anxiety (even suicidal thoughts)  Pt voiced understanding and will stop med and update if this occurs   Follow up planned 4-5 weeks Discussed importance of self care  Also exercise (she does well) Handout given       Relevant Medications   FLUoxetine (PROZAC) 10 MG capsule   Thoracic back pain    Seems muscular -in pt who is physically fit (dance and bar classes) Reassuring exam  Discussed use of nsaids and heat  Prescription tizanadine for bedtime prn with  caution of sedation  PT referral done  Update if not starting to improve in a week or if worsening  Call back and Er precautions noted in detail today        Relevant Medications   tiZANidine (ZANAFLEX) 4 MG tablet   Other Relevant Orders   Ambulatory referral to Physical Therapy

## 2023-05-14 NOTE — Assessment & Plan Note (Signed)
Doing well with allegra 180 mg daily  Flonase daily  Is year round Well controlled with current medication

## 2023-06-05 ENCOUNTER — Other Ambulatory Visit: Payer: Self-pay | Admitting: Family Medicine

## 2023-06-19 ENCOUNTER — Ambulatory Visit (INDEPENDENT_AMBULATORY_CARE_PROVIDER_SITE_OTHER): Payer: BC Managed Care – PPO | Admitting: Family Medicine

## 2023-06-19 ENCOUNTER — Encounter: Payer: Self-pay | Admitting: Family Medicine

## 2023-06-19 VITALS — BP 102/68 | HR 88 | Temp 98.1°F | Ht 66.5 in | Wt 138.2 lb

## 2023-06-19 DIAGNOSIS — F419 Anxiety disorder, unspecified: Secondary | ICD-10-CM | POA: Diagnosis not present

## 2023-06-19 MED ORDER — ESCITALOPRAM OXALATE 10 MG PO TABS: 10.0000 mg | ORAL_TABLET | Freq: Every day | ORAL | 1 refills | Status: DC

## 2023-06-19 NOTE — Assessment & Plan Note (Addendum)
Overall some improvement in day time anxiety with fluoxetine 10 mg daily, but it is causing some sleep difficulty (early am awakening) even if taken in the am  Discussed a change do different ssri Will try lexapro 10 mg daily - start dosing in evening  Discussed expectations of SSRI medication including time to effectiveness and mechanism of action, also poss of side effects (early and late)- including mental fuzziness, weight or appetite change, nausea and poss of worse dep or anxiety (even suicidal thoughts)  Pt voiced understanding and will stop med and update if this occurs  Instructed to update if sleep issue does not improve or if mood worsens   Plan to touch base/message in 3-4 weeks  Option to go up to 20 mg later if needed  Also has holiday break upcoming from work so stress will be down for a while   Encouraged good self care Encouraged to keep up exercise Still has alprazolam for prn use

## 2023-06-19 NOTE — Progress Notes (Signed)
Subjective:    Patient ID: Stephanie [name], female    DOB: [birth year], 42 y.o.   MRN: [id number]  HPI  Wt Readings from Last 3 Encounters:  06/19/23 138 lb 4 oz (62.7 kg)  05/14/23 138 lb (62.6 kg)  06/06/22 129 lb (58.5 kg)   21.98 kg/m  Vitals:   06/19/23 1500  BP: 102/68  Pulse: 88  Temp: 98.1 F (36.7 C)  SpO2: 96%    Pt presents for follow up of mood and chronic health problems   Last visit noted GAD had increased with time and age  We started fluoxetine 10 mg daily  Goal to reduce need for xanax  Encouraged self care  Good exercise habits   Thinks the fluoxetine has helped some  Initially bothered her stomach  That got better   Now having trouble sleeping through the night  During the day anxiety is better   She took it at night to start  Then she changed it to am   No changes in stress level  Had a great trip to [place]      Patient Active Problem List   Diagnosis Date Noted   Thoracic back pain 05/14/2023   Uterine fibroid 12/18/2021   Dry eye 02/04/2021   Chronic LLQ pain 02/18/2020   Fatigue 11/11/2013   PLANTAR FASCIITIS 09/13/2009   Nocturnal enuresis 11/25/2008   Anxiety disorder 07/01/2007   Allergic rhinitis 03/08/2007   Past Medical History:  Diagnosis Date   Allergy    Anxiety    Nonorganic enuresis    nocturnal/lifetime   Past Surgical History:  Procedure Laterality Date   FLEXIBLE SIGMOIDOSCOPY N/A 04/15/2020   Procedure: FLEXIBLE SIGMOIDOSCOPY;  Surgeon: Toney Reil, MD;  Location: [facility];  Service: Gastroenterology;  Laterality: N/A;   TONSILLECTOMY     UTERINE FIBROID SURGERY  10/27/2021   Social History   Tobacco Use   Smoking status: Never   Smokeless tobacco: Never  Vaping Use   Vaping status: Never Used  Substance Use Topics   Alcohol use: No    Alcohol/week: 0.0 standard drinks of alcohol   Drug use: No   Family History  Problem Relation Age of Onset   Cancer Father        Pancreatic    Cancer Paternal Grandmother        Breast CA   Allergies  Allergen Reactions   Erythromycin     REACTION: GI UPSET   Fluoxetine     Trouble sleeping    Penicillins     REACTION: HIVES   Sulfa Antibiotics     rash   Current Outpatient Medications on File Prior to Visit  Medication Sig Dispense Refill   ALPRAZolam (XANAX) 0.25 MG tablet TAKE 1 TO 2 TABLETS BY MOUTH TWICE A DAY AS NEEDED 60 tablet 0   fexofenadine (ALLEGRA) 180 MG tablet Take 180 mg by mouth daily as needed.       fluticasone (FLONASE) 50 MCG/ACT nasal spray SPRAY 2 SPRAYS INTO EACH NOSTRIL EVERY DAY 48 mL 0   JUNEL 1/20 1-20 MG-MCG tablet Take 1 tablet by mouth daily.     No current facility-administered medications on file prior to visit.    Review of Systems  Constitutional:  Negative for activity change, appetite change, fatigue, fever and unexpected weight change.  HENT:  Negative for congestion, ear pain, rhinorrhea, sinus pressure and sore throat.   Eyes:  Negative for pain, redness and visual disturbance.  Respiratory:  Negative for cough, shortness of breath and wheezing.   Cardiovascular:  Negative for chest pain and palpitations.  Gastrointestinal:  Negative for abdominal pain, blood in stool, constipation and diarrhea.  Endocrine: Negative for polydipsia and polyuria.  Genitourinary:  Negative for dysuria, frequency and urgency.  Musculoskeletal:  Negative for arthralgias, back pain and myalgias.  Skin:  Negative for pallor and rash.  Allergic/Immunologic: Negative for environmental allergies.  Neurological:  Negative for dizziness, syncope and headaches.  Hematological:  Negative for adenopathy. Does not bruise/bleed easily.  Psychiatric/Behavioral:  Positive for sleep disturbance. Negative for decreased concentration and dysphoric mood. The patient is nervous/anxious.        Less anxious        Objective:   Physical Exam Constitutional:      General: She is not in acute distress.     Appearance: Normal appearance. She is normal weight. She is not ill-appearing.  Cardiovascular:     Rate and Rhythm: Normal rate.  Pulmonary:     Effort: Pulmonary effort is normal. No respiratory distress.  Neurological:     Mental Status: She is alert.     Coordination: Coordination normal.  Psychiatric:        Attention and Perception: Attention normal.        Mood and Affect: Mood is not anxious or depressed.        Speech: Speech normal.        Behavior: Behavior normal.        Cognition and Memory: Cognition and memory normal.     Comments: Candidly discusses symptoms and stressors              Assessment & Plan:   Problem List Items Addressed This Visit       Other   Anxiety disorder - Primary    Overall some improvement in day time anxiety with fluoxetine 10 mg daily, but it is causing some sleep difficulty (early am awakening) even if taken in the am  Discussed a change do different ssri Will try lexapro 10 mg daily - start dosing in evening  Discussed expectations of SSRI medication including time to effectiveness and mechanism of action, also poss of side effects (early and late)- including mental fuzziness, weight or appetite change, nausea and poss of worse dep or anxiety (even suicidal thoughts)  Pt voiced understanding and will stop med and update if this occurs  Instructed to update if sleep issue does not improve or if mood worsens   Plan to touch base/message in 3-4 weeks  Option to go up to 20 mg later if needed  Also has holiday break upcoming from work so stress will be down for a while   Encouraged good self care Encouraged to keep up exercise Still has alprazolam for prn use       Relevant Medications   escitalopram (LEXAPRO) 10 MG tablet

## 2023-06-19 NOTE — Patient Instructions (Addendum)
Stop the generic prozac  Start generic lexapro 10 mg  Take in evening to start  Hoping this will not hurt your sleep  If any intolerable side effects or if you feel worse- call/let us know   Otherwise message or call in 3-4 weeks to let me know how you are doing

## 2023-06-24 ENCOUNTER — Other Ambulatory Visit: Payer: Self-pay | Admitting: Family Medicine

## 2023-06-24 DIAGNOSIS — F419 Anxiety disorder, unspecified: Secondary | ICD-10-CM

## 2023-06-25 NOTE — Telephone Encounter (Signed)
Name of Medication:  Alprazolam Name of Pharmacy:  [facility] [address] Last [place] [facility] or Written Date and Quantity:  05/03/23 # 60 tab/ 0 refills  Last Office Visit and Type:  f/u on 06/29/23 Next Office Visit and Type:  none scheduled

## 2023-07-11 ENCOUNTER — Encounter: Payer: Self-pay | Admitting: Family Medicine

## 2023-07-12 ENCOUNTER — Other Ambulatory Visit: Payer: Self-pay | Admitting: Family Medicine

## 2023-07-29 ENCOUNTER — Other Ambulatory Visit: Payer: Self-pay | Admitting: Family Medicine

## 2023-08-26 ENCOUNTER — Other Ambulatory Visit: Payer: Self-pay | Admitting: Family Medicine

## 2023-08-26 DIAGNOSIS — F419 Anxiety disorder, unspecified: Secondary | ICD-10-CM

## 2023-08-28 NOTE — Telephone Encounter (Signed)
Name of Medication:  Alprazolam Name of Pharmacy:  [facility] [address] Last [place] [facility] or Written Date and Quantity:  06/25/23 # 60 tab/ 0 refills  Last Office Visit and Type:  f/u on 06/19/23 Next Office Visit and Type:  none scheduled

## 2023-10-27 ENCOUNTER — Other Ambulatory Visit: Payer: Self-pay | Admitting: Family Medicine

## 2023-10-27 DIAGNOSIS — F419 Anxiety disorder, unspecified: Secondary | ICD-10-CM

## 2023-10-29 NOTE — Telephone Encounter (Signed)
 Name of Medication:  Alprazolam  Name of Pharmacy:  [facility] [address] Last [place] or Written Date and Quantity:  08/28/23 # 60 tab/ 0 refills  Last Office Visit and Type:  f/u on 06/19/23 Next Office Visit and Type:  none scheduled

## 2023-12-19 ENCOUNTER — Other Ambulatory Visit: Payer: Self-pay | Admitting: Family Medicine

## 2023-12-19 DIAGNOSIS — F419 Anxiety disorder, unspecified: Secondary | ICD-10-CM

## 2023-12-21 NOTE — Telephone Encounter (Signed)
 Taking lexapro  10 mg  Is it still working ?   Does not sound like she has been able to take less xanax  so I assume still fair amount of anxiety? Would she like to go up to 20 mg on lexapro ?

## 2023-12-21 NOTE — Telephone Encounter (Signed)
 Name of Medication:  Alprazolam  Name of Pharmacy:  [facility] Last [place] or Written Date and Quantity: 10/29/23 # 60 tab/ 0 refills  Last Office Visit and Type:  f/u on 06/19/23 Next Office Visit and Type:  none scheduled

## 2024-01-09 ENCOUNTER — Other Ambulatory Visit: Payer: Self-pay | Admitting: Family Medicine

## 2024-01-21 DIAGNOSIS — Z1231 Encounter for screening mammogram for malignant neoplasm of breast: Secondary | ICD-10-CM | POA: Diagnosis not present

## 2024-01-21 DIAGNOSIS — Z01411 Encounter for gynecological examination (general) (routine) with abnormal findings: Secondary | ICD-10-CM | POA: Diagnosis not present

## 2024-01-21 DIAGNOSIS — Z1331 Encounter for screening for depression: Secondary | ICD-10-CM | POA: Diagnosis not present

## 2024-01-21 DIAGNOSIS — G479 Sleep disorder, unspecified: Secondary | ICD-10-CM | POA: Diagnosis not present

## 2024-01-21 DIAGNOSIS — Z0000 Encounter for general adult medical examination without abnormal findings: Secondary | ICD-10-CM | POA: Diagnosis not present

## 2024-01-21 DIAGNOSIS — Z131 Encounter for screening for diabetes mellitus: Secondary | ICD-10-CM | POA: Diagnosis not present

## 2024-01-21 DIAGNOSIS — Z13220 Encounter for screening for lipoid disorders: Secondary | ICD-10-CM | POA: Diagnosis not present

## 2024-01-21 DIAGNOSIS — N3281 Overactive bladder: Secondary | ICD-10-CM | POA: Diagnosis not present

## 2024-01-21 DIAGNOSIS — Z1329 Encounter for screening for other suspected endocrine disorder: Secondary | ICD-10-CM | POA: Diagnosis not present

## 2024-01-21 LAB — HM MAMMOGRAPHY

## 2024-01-25 ENCOUNTER — Other Ambulatory Visit: Payer: Self-pay | Admitting: Family Medicine

## 2024-01-31 DIAGNOSIS — Z1212 Encounter for screening for malignant neoplasm of rectum: Secondary | ICD-10-CM | POA: Diagnosis not present

## 2024-01-31 DIAGNOSIS — Z1211 Encounter for screening for malignant neoplasm of colon: Secondary | ICD-10-CM | POA: Diagnosis not present

## 2024-02-06 LAB — COLOGUARD: COLOGUARD: NEGATIVE

## 2024-03-22 ENCOUNTER — Other Ambulatory Visit: Payer: Self-pay | Admitting: Family Medicine

## 2024-03-22 DIAGNOSIS — F419 Anxiety disorder, unspecified: Secondary | ICD-10-CM

## 2024-03-24 NOTE — Telephone Encounter (Signed)
 Name of Medication:  Alprazolam  Name of Pharmacy:  [facility] [place] or Written Date and Quantity:  12/21/23, #60 Last Office Visit and Type:  06/19/23, mood f/u Next Office Visit and Type:  none Last Controlled Substance Agreement Date:  12/10/12 Last UDS:  none

## 2024-04-14 DIAGNOSIS — H0259 Other disorders affecting eyelid function: Secondary | ICD-10-CM | POA: Diagnosis not present

## 2024-04-14 DIAGNOSIS — H04123 Dry eye syndrome of bilateral lacrimal glands: Secondary | ICD-10-CM | POA: Diagnosis not present

## 2024-04-14 DIAGNOSIS — H16211 Exposure keratoconjunctivitis, right eye: Secondary | ICD-10-CM | POA: Diagnosis not present

## 2024-04-17 ENCOUNTER — Other Ambulatory Visit: Payer: Self-pay | Admitting: Family Medicine

## 2024-06-07 ENCOUNTER — Other Ambulatory Visit: Payer: Self-pay | Admitting: Family Medicine

## 2024-06-07 DIAGNOSIS — F419 Anxiety disorder, unspecified: Secondary | ICD-10-CM

## 2024-06-09 NOTE — Telephone Encounter (Signed)
Refilled. Please schedule a follow up.

## 2024-06-09 NOTE — Telephone Encounter (Signed)
 Name of Medication:  Alprazolam  Name of Pharmacy:  [facility] [place] or Written Date and Quantity:  03/24/24, #60 Last Office Visit and Type:  06/19/23, mood f/u Next Office Visit and Type:  none

## 2024-06-17 ENCOUNTER — Ambulatory Visit: Payer: Self-pay

## 2024-06-17 ENCOUNTER — Ambulatory Visit (INDEPENDENT_AMBULATORY_CARE_PROVIDER_SITE_OTHER): Admitting: Family Medicine

## 2024-06-17 ENCOUNTER — Encounter: Payer: Self-pay | Admitting: Family Medicine

## 2024-06-17 VITALS — BP 108/74 | HR 88 | Temp 98.5°F | Ht 66.5 in | Wt 140.0 lb

## 2024-06-17 DIAGNOSIS — J0100 Acute maxillary sinusitis, unspecified: Secondary | ICD-10-CM | POA: Diagnosis not present

## 2024-06-17 MED ORDER — DOXYCYCLINE HYCLATE 100 MG PO TABS: 100.0000 mg | ORAL_TABLET | Freq: Two times a day (BID) | ORAL | 0 refills | Status: AC

## 2024-06-17 NOTE — Patient Instructions (Signed)
 VISIT SUMMARY: You came in today with symptoms of a sinus infection that started around Thanksgiving and have worsened recently. You are experiencing significant sinus pressure and pain on the left side of your forehead and under your left eye, along with tooth pain that gets worse when you chew or open your mouth. You have no fever, ear pain, or other respiratory or gastrointestinal symptoms. You mentioned that you have had similar sinus infections in the past and have tolerated doxycycline  well.  YOUR PLAN: -ACUTE BACTERIAL SINUSITIS: Acute bacterial sinusitis is an infection of the sinuses that usually follows a viral infection and causes symptoms like sinus pressure, pain, and congestion. You have been prescribed doxycycline  for 10 days to treat the infection. Please avoid sun exposure while taking this medication as it can increase your risk of sunburn. Make sure to drink plenty of fluids and get enough rest. You can take acetaminophen or ibuprofen to help with the pain. Monitor your symptoms and contact us  if you do not see any improvement after several days or if you experience any complications.  INSTRUCTIONS: Please follow the prescribed course of doxycycline  for 10 days and avoid sun exposure. Drink plenty of fluids, get adequate rest, and use acetaminophen or ibuprofen for pain relief. Monitor your symptoms and report back if there is no improvement after several days or if you experience any complications.

## 2024-06-17 NOTE — Telephone Encounter (Signed)
 Aware, will watch for correspondence Thanks for seeing her

## 2024-06-17 NOTE — Telephone Encounter (Signed)
 Seen today, treated for acute bacterial maxillary sinusitis

## 2024-06-17 NOTE — Progress Notes (Signed)
 Ph: [phone] Fax: [phone]   Patient ID: Stephanie [name], female    DOB: [birth year], 43 y.o.   MRN: [id number]  This visit was conducted in person.  BP 108/74 (Cuff Size: Normal)   Pulse 88   Temp 98.5 F (36.9 C) (Oral)   Ht 5' 6.5 (1.689 m)   Wt 140 lb (63.5 kg)   SpO2 98%   BMI 22.26 kg/m    Chief Complaint  Patient presents with   Acute Visit    Sinus pain, congestion green discharge, Teeth hurt   Symptoms began 2 weeks ago.   Interventions attempted: OTC medications:  mucinex   Symptoms are: gradually worsening.     Subjective:   Discussed the use of AI scribe software for clinical note transcription with the patient, who gave verbal consent to proceed.  History of Present Illness   Stephanie [name] is a 43 year old female who presents with symptoms suggestive of a sinus infection.  She developed cold symptoms over Thanksgiving that have persisted for a couple of weeks, with worsening over the weekend. She now has significant sinus pressure and pain, mainly on the left forehead and under the left eye, with associated tooth pain that worsens with chewing and opening her mouth.  She has had no ear pain, fever, chills, hoarseness, cough, chest congestion, wheezing, or gastrointestinal symptoms. She notes throat clearing from congestion.  She has had similar sinus infections in the past and tolerated doxycycline  for sinusitis two years ago. Medication allergies include azithromycin and erythromycin causing nausea, penicillins causing hives, and sulfa  drugs causing a severe rash. 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          Relevant past medical, surgical, family and social history reviewed and updated as indicated. Interim medical history since our last visit reviewed. Allergies and medications reviewed and updated. Outpatient Medications Prior to Visit  Medication Sig Dispense Refill   ALPRAZolam  (XANAX )  0.25 MG tablet TAKE 1 TO 2 TABLETS BY MOUTH TWICE A DAY AS NEEDED 60 tablet 0   escitalopram  (LEXAPRO ) 20 MG tablet Take 20 mg by mouth daily.     fexofenadine (ALLEGRA) 180 MG tablet Take 180 mg by mouth daily as needed.   (Patient taking differently: Take 180 mg by mouth daily.)     fluticasone  (FLONASE  ALLERGY RELIEF) 50 MCG/ACT nasal spray Place 2 sprays into both nostrils daily.     fluticasone  (FLONASE ) 50 MCG/ACT nasal spray SPRAY 2 SPRAYS INTO EACH NOSTRIL EVERY DAY 48 mL 1   JUNEL 1/20 1-20 MG-MCG tablet Take 1 tablet by mouth daily.     prednisoLONE acetate (PRED FORTE) 1 % ophthalmic suspension Place into the left eye.     escitalopram  (LEXAPRO ) 10 MG tablet TAKE 1 TABLET BY MOUTH EVERY DAY 90 tablet 0   No facility-administered medications prior to visit.     Per HPI unless specifically indicated in ROS section below Review of Systems  Objective:  BP 108/74 (Cuff Size: Normal)   Pulse 88   Temp 98.5 F (36.9 C) (Oral)   Ht 5' 6.5 (1.689 m)   Wt 140 lb (63.5 kg)   SpO2 98%   BMI 22.26 kg/m   Wt Readings from Last 3 Encounters:  06/17/24 140 lb (63.5 kg)  06/19/23 138 lb 4 oz (62.7 kg)  05/14/23 138 lb (62.6 kg)      Physical Exam Vitals and nursing note reviewed.  Constitutional:      Appearance: Normal appearance. She is not ill-appearing.  HENT:     Head: Normocephalic and atraumatic.     Right Ear: Ear canal and external ear normal.     Left Ear: Tympanic membrane, ear canal and external ear normal. There is no impacted cerumen.     Ears:     Comments: Cerumen covering TM on right    Nose: Mucosal edema, congestion and rhinorrhea present. Rhinorrhea is purulent.     Right Turbinates: Not pale.     Left Turbinates: Enlarged and swollen. Not pale.     Right Sinus: Frontal sinus tenderness present. No maxillary sinus tenderness.     Left Sinus: Maxillary sinus tenderness and frontal sinus tenderness present.     Comments: Erythematous nasal mucosa with  purulent drainage present to nasopharynx.     Mouth/Throat:     Mouth: Mucous membranes are moist.     Pharynx: Oropharynx is clear. No oropharyngeal exudate or posterior oropharyngeal erythema.     Tonsils: No tonsillar exudate.  Eyes:     Extraocular Movements: Extraocular movements intact.     Conjunctiva/sclera: Conjunctivae normal.     Pupils: Pupils are equal, round, and reactive to light.  Cardiovascular:     Rate and Rhythm: Normal rate and regular rhythm.     Pulses: Normal pulses.     Heart sounds: Normal heart sounds. No murmur heard. Pulmonary:     Effort: Pulmonary effort is normal. No respiratory distress.     Breath sounds: Normal breath sounds. No wheezing, rhonchi or rales.  Lymphadenopathy:     Head:     Right side of head: No submental, submandibular, tonsillar, preauricular or posterior auricular adenopathy.     Left side of head: No submental, submandibular, tonsillar, preauricular or posterior auricular adenopathy.     Cervical: No cervical adenopathy.     Right cervical: No superficial cervical adenopathy.    Left cervical: No superficial cervical adenopathy.     Upper Body:     Right upper body: No supraclavicular adenopathy.     Left upper body: No supraclavicular adenopathy.  Skin:    Findings: No rash.  Neurological:     Mental Status: She is alert.  Psychiatric:        Mood and Affect: Mood normal.        Behavior: Behavior normal.        Assessment & Plan:      Acute bacterial sinusitis Left-sided sinus pressure, tooth pain, and congestion suggest bacterial sinusitis post-viral infection. Allergies limit antibiotic options. - Prescribed doxycycline  for 10 days. - Advised on sunburn risk with doxycycline , recommended sun avoidance. - Encouraged increased fluid intake and rest. - Recommended acetaminophen or ibuprofen for pain. - Instructed to monitor for complications and report if no improvement after several days.       Problem List  Items Addressed This Visit     Acute non-recurrent maxillary sinusitis - Primary   Anticipate bacterial given duration and progression of symptoms.  Treat with 10d doxycycline  antibitoic with photosensitivity precautions. Continue antihistamine, flonase . Rec nasal saline, may continue mucinex. Update if not improving with treatment.       Relevant Medications   fluticasone  (FLONASE  ALLERGY RELIEF) 50 MCG/ACT nasal spray   doxycycline  (VIBRA -TABS) 100 MG tablet     Meds ordered this encounter  Medications   doxycycline  (VIBRA -TABS) 100 MG tablet    Sig: Take 1 tablet (100 mg total) by mouth 2 (two)  times daily.    Dispense:  20 tablet    Refill:  0    No orders of the defined types were placed in this encounter.   Patient Instructions  VISIT SUMMARY: You came in today with symptoms of a sinus infection that started around Thanksgiving and have worsened recently. You are experiencing significant sinus pressure and pain on the left side of your forehead and under your left eye, along with tooth pain that gets worse when you chew or open your mouth. You have no fever, ear pain, or other respiratory or gastrointestinal symptoms. You mentioned that you have had similar sinus infections in the past and have tolerated doxycycline  well.  YOUR PLAN: -ACUTE BACTERIAL SINUSITIS: Acute bacterial sinusitis is an infection of the sinuses that usually follows a viral infection and causes symptoms like sinus pressure, pain, and congestion. You have been prescribed doxycycline  for 10 days to treat the infection. Please avoid sun exposure while taking this medication as it can increase your risk of sunburn. Make sure to drink plenty of fluids and get enough rest. You can take acetaminophen or ibuprofen to help with the pain. Monitor your symptoms and contact us  if you do not see any improvement after several days or if you experience any complications.  INSTRUCTIONS: Please follow the prescribed  course of doxycycline  for 10 days and avoid sun exposure. Drink plenty of fluids, get adequate rest, and use acetaminophen or ibuprofen for pain relief. Monitor your symptoms and report back if there is no improvement after several days or if you experience any complications.  Follow up plan: Return if symptoms worsen or fail to improve.  Anton Blas, MD

## 2024-06-17 NOTE — Telephone Encounter (Signed)
 FYI Only or Action Required?: this afternoon  Patient was last seen in primary care on 06/19/2023 by Randeen Laine LABOR, MD.  Called Nurse Triage reporting Sinusitis. Sinus pain, congestion green discharge, Teeth hurt  Symptoms began several weeks ago.  Interventions attempted: OTC medications:  SABRA  Symptoms are: gradually worsening.  Triage Disposition: See PCP When Office is Open (Within 3 Days)  Patient/caregiver understands and will follow disposition?: Yes                         Copied from CRM #[id number]. Topic: Clinical - Red Word Triage >> Jun 17, 2024  9:10 AM Frederich PARAS wrote: [facility] that prompted transfer to Nurse Triage: head pain teeth pain sinus  Possible sinus infection, pt says she couldn't sleep, sinus pain, teeth are in pain due to the sinus infection, very green the mucus, head pain. Reason for Disposition  [1] Nasal discharge AND [2] present > 10 days  Answer Assessment - Initial Assessment Questions 1. LOCATION: Where does it hurt?      Mask of face 2. ONSET: When did the sinus pain start?  (e.g., hours, days)      Thanksgiving 3. SEVERITY: How bad is the pain?   (Scale 0-10; or none, mild, moderate or severe)     moderate 4. RECURRENT SYMPTOM: Have you ever had sinus problems before? If Yes, ask: When was the last time? and What happened that time?      yes 5. NASAL CONGESTION: Is the nose blocked? If Yes, ask: Can you open it or must you breathe through your mouth?     Yes - but can breath through 6. NASAL DISCHARGE: Do you have discharge from your nose? If so ask, What color?     Green 7. FEVER: Do you have a fever? If Yes, ask: What is it, how was it measured, and when did it start?      no 8. OTHER SYMPTOMS: Do you have any other symptoms? (e.g., sore, cough, earache, difficulty breathing)     Teeth hurt, sinus pressure and pain - thinks this is a sinus infection  Protocols used: Sinus Pain or  Congestion-A-AH

## 2024-06-17 NOTE — Assessment & Plan Note (Signed)
 Anticipate bacterial given duration and progression of symptoms.  Treat with 10d doxycycline  antibitoic with photosensitivity precautions. Continue antihistamine, flonase . Rec nasal saline, may continue mucinex. Update if not improving with treatment.

## 2024-08-05 ENCOUNTER — Other Ambulatory Visit: Payer: Self-pay | Admitting: Family Medicine

## 2024-08-05 DIAGNOSIS — F419 Anxiety disorder, unspecified: Secondary | ICD-10-CM

## 2024-08-05 NOTE — Telephone Encounter (Signed)
 Pt is overdue for CPE or at least a f/u. Looks like message was sent to the pool last month to get appt scheduled but no appt has been scheduled. Please try and reach out to pt again to try and get appt scheduled. Thanks

## 2024-08-05 NOTE — Telephone Encounter (Signed)
 Called pt to schedule appt for cpe pt stated that she only see dr Randeen for med refills she  sees obgyn dr for cpe so I schedule a ov with [facility]

## 2024-08-06 NOTE — Telephone Encounter (Signed)
 Name of Medication:  Alprazolam  Name of Pharmacy:  [facility] Last Kandra or Written Date and Quantity:  03/24/24, #60 Last Office Visit and Type:  06/19/23, mood f/u Next Office Visit and Type:  f/u on 08/18/24

## 2024-08-18 ENCOUNTER — Ambulatory Visit: Admitting: Family Medicine
